# Patient Record
Sex: Female | Born: 1975 | Race: White | Marital: Married | Smoking: Former smoker
Health system: Southern US, Community
[De-identification: ages and names within clinical notes are randomized; demographics above are authoritative.]

## PROBLEM LIST (undated history)

## (undated) DIAGNOSIS — IMO0001 Reserved for inherently not codable concepts without codable children: Secondary | ICD-10-CM

## (undated) DIAGNOSIS — Z87442 Personal history of urinary calculi: Secondary | ICD-10-CM

## (undated) DIAGNOSIS — M5030 Other cervical disc degeneration, unspecified cervical region: Secondary | ICD-10-CM

## (undated) DIAGNOSIS — M4802 Spinal stenosis, cervical region: Secondary | ICD-10-CM

## (undated) DIAGNOSIS — T4145XA Adverse effect of unspecified anesthetic, initial encounter: Secondary | ICD-10-CM

## (undated) DIAGNOSIS — M47812 Spondylosis without myelopathy or radiculopathy, cervical region: Secondary | ICD-10-CM

## (undated) DIAGNOSIS — T8859XA Other complications of anesthesia, initial encounter: Secondary | ICD-10-CM

## (undated) DIAGNOSIS — I499 Cardiac arrhythmia, unspecified: Secondary | ICD-10-CM

## (undated) DIAGNOSIS — F419 Anxiety disorder, unspecified: Secondary | ICD-10-CM

## (undated) DIAGNOSIS — M1990 Unspecified osteoarthritis, unspecified site: Secondary | ICD-10-CM

## (undated) HISTORY — PX: TOTAL HIP ARTHROPLASTY: SHX124

## (undated) HISTORY — DX: Spondylosis without myelopathy or radiculopathy, cervical region: M47.812

## (undated) HISTORY — PX: OTHER SURGICAL HISTORY: SHX169

## (undated) HISTORY — DX: Spinal stenosis, cervical region: M48.02

## (undated) HISTORY — DX: Other cervical disc degeneration, unspecified cervical region: M50.30

## (undated) HISTORY — DX: Unspecified osteoarthritis, unspecified site: M19.90

## (undated) HISTORY — PX: CARDIAC ELECTROPHYSIOLOGY STUDY AND ABLATION: SHX1294

---

## 1997-05-30 HISTORY — PX: TUBAL LIGATION: SHX77

## 2013-01-28 HISTORY — PX: CYSTOSCOPY: SUR368

## 2014-12-24 ENCOUNTER — Other Ambulatory Visit: Payer: Self-pay | Admitting: Orthopedic Surgery

## 2014-12-24 DIAGNOSIS — M25312 Other instability, left shoulder: Secondary | ICD-10-CM

## 2015-01-06 ENCOUNTER — Ambulatory Visit
Admission: RE | Admit: 2015-01-06 | Discharge: 2015-01-06 | Disposition: A | Discharge status: Home / Self Care | Payer: BLUE CROSS/BLUE SHIELD | Source: Ambulatory Visit | Attending: Orthopedic Surgery | Admitting: Orthopedic Surgery

## 2015-01-06 DIAGNOSIS — M25312 Other instability, left shoulder: Secondary | ICD-10-CM

## 2015-01-06 MED ORDER — IOHEXOL 180 MG/ML  SOLN
15.0000 mL | Freq: Once | INTRAMUSCULAR | Status: DC | PRN
  Administered 2015-01-06: 15 mL via INTRA_ARTICULAR

## 2015-01-26 ENCOUNTER — Encounter (HOSPITAL_COMMUNITY)
Admission: RE | Admit: 2015-01-26 | Discharge: 2015-01-26 | Disposition: A | Discharge status: Home / Self Care | Payer: BLUE CROSS/BLUE SHIELD | Source: Ambulatory Visit | Attending: Orthopedic Surgery | Admitting: Orthopedic Surgery

## 2015-01-26 ENCOUNTER — Encounter (HOSPITAL_COMMUNITY): Payer: Self-pay

## 2015-01-26 DIAGNOSIS — F17210 Nicotine dependence, cigarettes, uncomplicated: Secondary | ICD-10-CM | POA: Diagnosis not present

## 2015-01-26 DIAGNOSIS — Z7982 Long term (current) use of aspirin: Secondary | ICD-10-CM | POA: Diagnosis not present

## 2015-01-26 DIAGNOSIS — F419 Anxiety disorder, unspecified: Secondary | ICD-10-CM | POA: Diagnosis not present

## 2015-01-26 DIAGNOSIS — M25312 Other instability, left shoulder: Secondary | ICD-10-CM | POA: Diagnosis present

## 2015-01-26 HISTORY — DX: Reserved for inherently not codable concepts without codable children: IMO0001

## 2015-01-26 HISTORY — DX: Personal history of urinary calculi: Z87.442

## 2015-01-26 HISTORY — DX: Adverse effect of unspecified anesthetic, initial encounter: T41.45XA

## 2015-01-26 HISTORY — DX: Cardiac arrhythmia, unspecified: I49.9

## 2015-01-26 HISTORY — DX: Anxiety disorder, unspecified: F41.9

## 2015-01-26 HISTORY — DX: Other complications of anesthesia, initial encounter: T88.59XA

## 2015-01-26 LAB — CBC WITH DIFFERENTIAL/PLATELET
BASOS ABS: 0 10*3/uL (ref 0.0–0.1)
BASOS PCT: 0 % (ref 0–1)
EOS ABS: 0.2 10*3/uL (ref 0.0–0.7)
EOS PCT: 2 % (ref 0–5)
HCT: 39.4 % (ref 36.0–46.0)
HEMOGLOBIN: 13.4 g/dL (ref 12.0–15.0)
Lymphocytes Relative: 50 % — ABNORMAL HIGH (ref 12–46)
Lymphs Abs: 4 10*3/uL (ref 0.7–4.0)
MCH: 31.2 pg (ref 26.0–34.0)
MCHC: 34 g/dL (ref 30.0–36.0)
MCV: 91.6 fL (ref 78.0–100.0)
Monocytes Absolute: 0.7 10*3/uL (ref 0.1–1.0)
Monocytes Relative: 9 % (ref 3–12)
NEUTROS PCT: 39 % — AB (ref 43–77)
Neutro Abs: 3.2 10*3/uL (ref 1.7–7.7)
PLATELETS: 227 10*3/uL (ref 150–400)
RBC: 4.3 MIL/uL (ref 3.87–5.11)
RDW: 12.9 % (ref 11.5–15.5)
WBC: 8.1 10*3/uL (ref 4.0–10.5)

## 2015-01-26 LAB — COMPREHENSIVE METABOLIC PANEL
ALBUMIN: 4.1 g/dL (ref 3.5–5.0)
ALT: 14 U/L (ref 14–54)
AST: 17 U/L (ref 15–41)
Alkaline Phosphatase: 43 U/L (ref 38–126)
Anion gap: 7 (ref 5–15)
BUN: 13 mg/dL (ref 6–20)
CHLORIDE: 105 mmol/L (ref 101–111)
CO2: 25 mmol/L (ref 22–32)
CREATININE: 0.71 mg/dL (ref 0.44–1.00)
Calcium: 9.5 mg/dL (ref 8.9–10.3)
GFR calc non Af Amer: >60 mL/min (ref >60)
GLUCOSE: 94 mg/dL (ref 65–99)
Potassium: 4.2 mmol/L (ref 3.5–5.1)
SODIUM: 137 mmol/L (ref 135–145)
Total Bilirubin: 0.5 mg/dL (ref 0.3–1.2)
Total Protein: 7 g/dL (ref 6.5–8.1)

## 2015-01-26 LAB — HCG, SERUM, QUALITATIVE: PREG SERUM: NEGATIVE

## 2015-01-26 LAB — PROTIME-INR
INR: 0.94 (ref 0.00–1.49)
Prothrombin Time: 12.8 seconds (ref 11.6–15.2)

## 2015-01-26 LAB — APTT: APTT: 27 s (ref 24–37)

## 2015-01-26 NOTE — Pre-Procedure Instructions (Addendum)
    Amber Vazquez  01/26/2015    Your procedure is scheduled on Thursday, September 1.  Report to [facility] Admitting at 5:30 A.M.                   Your surgery is scheduled for 7:30AM    Call this number if you have problems the morning of [phone]                For any other questions, please call [phone], Monday - Friday 8 AM - 4 PM.   Remember:  Do not eat food or drink liquids after midnight Wednesday, August 31.  Take these medicines the morning of surgery with A SIP OF WATER : metoprolol (LOPRESSOR).                  Take if needed: clonazePAM (KLONOPIN).                Stop taking Aspirin.   Do not wear jewelry, make-up or nail polish.  Do not wear lotions, powders, or perfumes.    Do not shave 48 hours prior to surgery.   Do not bring valuables to the hospital.  [facility] is not responsible for any belongings or valuables.  Contacts, dentures or bridgework may not be worn into surgery.  Leave your suitcase in the car.  After surgery it may be brought to your room.  For patients admitted to the hospital, discharge time will be determined by your treatment team.  Patients discharged the day of surgery will not be allowed to drive home.   Name and phone number of your driver:   -  Special instructions:  Review   - Preparing For Surgery.  Please read over the following fact sheets that you were given. Pain Booklet, Coughing and Deep Breathing and Surgical Site Infection Prevention  And Incentive Spirometery

## 2015-01-28 MED ORDER — CHLORHEXIDINE GLUCONATE 4 % EX LIQD
60.0000 mL | Freq: Once | CUTANEOUS | Status: DC
Start: 2015-01-29 — End: 2015-01-29

## 2015-01-28 MED ORDER — LACTATED RINGERS IV SOLN
INTRAVENOUS | Status: DC
  Administered 2015-01-29: 07:00:00 via INTRAVENOUS

## 2015-01-28 MED ORDER — CEFAZOLIN SODIUM-DEXTROSE 2-3 GM-% IV SOLR
2.0000 g | INTRAVENOUS | Status: AC
  Administered 2015-01-29: 2 g via INTRAVENOUS
  Filled 2015-01-28: qty 50

## 2015-01-29 ENCOUNTER — Ambulatory Visit (HOSPITAL_COMMUNITY): Payer: BLUE CROSS/BLUE SHIELD | Admitting: Certified Registered Nurse Anesthetist

## 2015-01-29 ENCOUNTER — Encounter (HOSPITAL_COMMUNITY): Payer: Self-pay | Admitting: *Deleted

## 2015-01-29 ENCOUNTER — Ambulatory Visit (HOSPITAL_COMMUNITY)
Admission: RE | Admit: 2015-01-29 | Discharge: 2015-01-29 | Disposition: A | Discharge status: Home / Self Care | Payer: BLUE CROSS/BLUE SHIELD | Source: Ambulatory Visit | Attending: Orthopedic Surgery | Admitting: Orthopedic Surgery

## 2015-01-29 ENCOUNTER — Encounter (HOSPITAL_COMMUNITY)
Admission: RE | Disposition: A | Discharge status: Home / Self Care | Payer: Self-pay | Source: Ambulatory Visit | Attending: Orthopedic Surgery

## 2015-01-29 DIAGNOSIS — F17210 Nicotine dependence, cigarettes, uncomplicated: Secondary | ICD-10-CM | POA: Insufficient documentation

## 2015-01-29 DIAGNOSIS — Z7982 Long term (current) use of aspirin: Secondary | ICD-10-CM | POA: Insufficient documentation

## 2015-01-29 DIAGNOSIS — M25312 Other instability, left shoulder: Secondary | ICD-10-CM | POA: Insufficient documentation

## 2015-01-29 DIAGNOSIS — F419 Anxiety disorder, unspecified: Secondary | ICD-10-CM | POA: Insufficient documentation

## 2015-01-29 HISTORY — PX: SHOULDER ARTHROSCOPY WITH BANKART REPAIR: SHX5673

## 2015-01-29 SURGERY — SHOULDER ARTHROSCOPY WITH BANKART REPAIR
Anesthesia: Regional | Site: Shoulder | Laterality: Left

## 2015-01-29 MED ORDER — DIAZEPAM 5 MG PO TABS: 2.5000 mg | ORAL_TABLET | Freq: Four times a day (QID) | ORAL | Status: DC | PRN

## 2015-01-29 MED ORDER — PROPOFOL 10 MG/ML IV BOLUS
INTRAVENOUS | Status: DC | PRN
  Administered 2015-01-29: 200 mg via INTRAVENOUS

## 2015-01-29 MED ORDER — SODIUM CHLORIDE 0.9 % IR SOLN
Status: DC | PRN
  Administered 2015-01-29: 6000 mL

## 2015-01-29 MED ORDER — ONDANSETRON HCL 4 MG/2ML IJ SOLN
INTRAMUSCULAR | Status: DC | PRN
Start: 2015-01-29 — End: 2015-01-29
  Administered 2015-01-29: 4 mg via INTRAVENOUS

## 2015-01-29 MED ORDER — MIDAZOLAM HCL 2 MG/2ML IJ SOLN
INTRAMUSCULAR | Status: AC
  Filled 2015-01-29: qty 4

## 2015-01-29 MED ORDER — NEOSTIGMINE METHYLSULFATE 10 MG/10ML IV SOLN
INTRAVENOUS | Status: AC
  Filled 2015-01-29: qty 1

## 2015-01-29 MED ORDER — FENTANYL CITRATE (PF) 250 MCG/5ML IJ SOLN
INTRAMUSCULAR | Status: AC
  Filled 2015-01-29: qty 5

## 2015-01-29 MED ORDER — MEPERIDINE HCL 25 MG/ML IJ SOLN: 6.2500 mg | INTRAMUSCULAR | Status: DC | PRN

## 2015-01-29 MED ORDER — PROPOFOL 10 MG/ML IV BOLUS
INTRAVENOUS | Status: AC
  Filled 2015-01-29: qty 20

## 2015-01-29 MED ORDER — FENTANYL CITRATE (PF) 100 MCG/2ML IJ SOLN
INTRAMUSCULAR | Status: DC | PRN
  Administered 2015-01-29: 50 ug via INTRAVENOUS
  Administered 2015-01-29 (×2): 100 ug via INTRAVENOUS

## 2015-01-29 MED ORDER — GLYCOPYRROLATE 0.2 MG/ML IJ SOLN
INTRAMUSCULAR | Status: DC | PRN
  Administered 2015-01-29: 0.6 mg via INTRAVENOUS

## 2015-01-29 MED ORDER — LIDOCAINE HCL (CARDIAC) 20 MG/ML IV SOLN
INTRAVENOUS | Status: DC | PRN
  Administered 2015-01-29: 50 mg via INTRAVENOUS

## 2015-01-29 MED ORDER — HYDROMORPHONE HCL 1 MG/ML IJ SOLN: 0.2500 mg | INTRAMUSCULAR | Status: DC | PRN

## 2015-01-29 MED ORDER — GLYCOPYRROLATE 0.2 MG/ML IJ SOLN
INTRAMUSCULAR | Status: AC
  Filled 2015-01-29: qty 3

## 2015-01-29 MED ORDER — NAPROXEN 500 MG PO TABS: 500.0000 mg | ORAL_TABLET | Freq: Two times a day (BID) | ORAL | Status: DC

## 2015-01-29 MED ORDER — ROCURONIUM BROMIDE 100 MG/10ML IV SOLN
INTRAVENOUS | Status: DC | PRN
  Administered 2015-01-29: 50 mg via INTRAVENOUS

## 2015-01-29 MED ORDER — MIDAZOLAM HCL 5 MG/5ML IJ SOLN
INTRAMUSCULAR | Status: DC | PRN
Start: 2015-01-29 — End: 2015-01-29
  Administered 2015-01-29 (×2): 2 mg via INTRAVENOUS

## 2015-01-29 MED ORDER — SUGAMMADEX SODIUM 500 MG/5ML IV SOLN
INTRAVENOUS | Status: AC
  Filled 2015-01-29: qty 5

## 2015-01-29 MED ORDER — ONDANSETRON HCL 4 MG PO TABS: 4.0000 mg | ORAL_TABLET | Freq: Three times a day (TID) | ORAL | Status: DC | PRN

## 2015-01-29 MED ORDER — NEOSTIGMINE METHYLSULFATE 10 MG/10ML IV SOLN
INTRAVENOUS | Status: DC | PRN
  Administered 2015-01-29: 5 mg via INTRAVENOUS

## 2015-01-29 MED ORDER — ONDANSETRON HCL 4 MG/2ML IJ SOLN: 4.0000 mg | Freq: Once | INTRAMUSCULAR | Status: DC | PRN

## 2015-01-29 MED ORDER — ONDANSETRON HCL 4 MG/2ML IJ SOLN
INTRAMUSCULAR | Status: AC
  Filled 2015-01-29: qty 2

## 2015-01-29 MED ORDER — OXYCODONE-ACETAMINOPHEN 5-325 MG PO TABS: 1.0000 | ORAL_TABLET | ORAL | Status: DC | PRN

## 2015-01-29 MED ORDER — LIDOCAINE HCL (CARDIAC) 20 MG/ML IV SOLN
INTRAVENOUS | Status: AC
  Filled 2015-01-29: qty 5

## 2015-01-29 MED ORDER — MIDAZOLAM HCL 5 MG/5ML IJ SOLN: INTRAMUSCULAR | Status: DC | PRN

## 2015-01-29 SURGICAL SUPPLY — 57 items
ANCHOR JUGGERKNOT SZ1 (Anchor) ×12 IMPLANT
BLADE CUTTER GATOR 3.5 (BLADE) ×3 IMPLANT
BLADE GREAT WHITE 4.2 (BLADE) ×2 IMPLANT
BLADE GREAT WHITE 4.2MM (BLADE) ×1
BLADE SURG 11 STRL SS (BLADE) ×3 IMPLANT
BOOTCOVER CLEANROOM LRG (PROTECTIVE WEAR) ×6 IMPLANT
BUR OVAL 4.0 (BURR) ×3 IMPLANT
CANISTER SUCT LVC 12 LTR MEDI- (MISCELLANEOUS) ×3 IMPLANT
CANNULA ACUFLEX KIT 5X76 (CANNULA) ×3 IMPLANT
CLOSURE WOUND 1/2 X4 (GAUZE/BANDAGES/DRESSINGS) ×1
DRAPE INCISE 23X17 IOBAN STRL (DRAPES)
DRAPE INCISE IOBAN 23X17 STRL (DRAPES) IMPLANT
DRAPE INCISE IOBAN 66X45 STRL (DRAPES) ×3 IMPLANT
DRAPE STERI 35X30 U-POUCH (DRAPES) IMPLANT
DRAPE SURG 17X11 SM STRL (DRAPES) ×3 IMPLANT
DRAPE U-SHAPE 47X51 STRL (DRAPES) IMPLANT
DRSG PAD ABDOMINAL 8X10 ST (GAUZE/BANDAGES/DRESSINGS) ×3 IMPLANT
DURAPREP 26ML APPLICATOR (WOUND CARE) ×6 IMPLANT
GAUZE SPONGE 4X4 12PLY STRL (GAUZE/BANDAGES/DRESSINGS) ×3 IMPLANT
GLOVE BIO SURGEON STRL SZ7.5 (GLOVE) ×3 IMPLANT
GLOVE BIO SURGEON STRL SZ8 (GLOVE) ×3 IMPLANT
GLOVE EUDERMIC 7 POWDERFREE (GLOVE) ×3 IMPLANT
GLOVE SS BIOGEL STRL SZ 7.5 (GLOVE) ×1 IMPLANT
GLOVE SUPERSENSE BIOGEL SZ 7.5 (GLOVE) ×2
GOWN STRL REUS W/ TWL LRG LVL3 (GOWN DISPOSABLE) ×1 IMPLANT
GOWN STRL REUS W/ TWL XL LVL3 (GOWN DISPOSABLE) ×2 IMPLANT
GOWN STRL REUS W/TWL LRG LVL3 (GOWN DISPOSABLE) ×2
GOWN STRL REUS W/TWL XL LVL3 (GOWN DISPOSABLE) ×4
KIT BASIN OR (CUSTOM PROCEDURE TRAY) ×3 IMPLANT
KIT ROOM TURNOVER OR (KITS) ×3 IMPLANT
KIT SHOULDER TRACTION (DRAPES) ×3 IMPLANT
MANIFOLD NEPTUNE II (INSTRUMENTS) ×3 IMPLANT
NDL SUT 6 .5 CRC .975X.05 MAYO (NEEDLE) IMPLANT
NEEDLE MAYO TAPER (NEEDLE)
NEEDLE SPNL 18GX3.5 QUINCKE PK (NEEDLE) ×3 IMPLANT
NS IRRIG 1000ML POUR BTL (IV SOLUTION) ×3 IMPLANT
PACK SHOULDER (CUSTOM PROCEDURE TRAY) ×3 IMPLANT
PAD ARMBOARD 7.5X6 YLW CONV (MISCELLANEOUS) ×6 IMPLANT
SET ARTHROSCOPY TUBING (MISCELLANEOUS) ×2
SET ARTHROSCOPY TUBING LN (MISCELLANEOUS) ×1 IMPLANT
SLING ARM IMMOBILIZER LRG (SOFTGOODS) IMPLANT
SLING ARM LRG ADULT FOAM STRAP (SOFTGOODS) IMPLANT
SLING ARM MED ADULT FOAM STRAP (SOFTGOODS) IMPLANT
SPONGE GAUZE 4X4 12PLY STER LF (GAUZE/BANDAGES/DRESSINGS) ×3 IMPLANT
SPONGE LAP 4X18 X RAY DECT (DISPOSABLE) IMPLANT
STRIP CLOSURE SKIN 1/2X4 (GAUZE/BANDAGES/DRESSINGS) ×2 IMPLANT
SUT LASSO 45 DEG R (SUTURE) ×3 IMPLANT
SUT MNCRL AB 3-0 PS2 18 (SUTURE) ×3 IMPLANT
SUT MNCRL AB 4-0 PS2 18 (SUTURE) IMPLANT
SUT PDS AB 1 CT  36 (SUTURE)
SUT PDS AB 1 CT 36 (SUTURE) IMPLANT
SYR 20CC LL (SYRINGE) IMPLANT
TAPE PAPER 3X10 WHT MICROPORE (GAUZE/BANDAGES/DRESSINGS) ×3 IMPLANT
TOWEL OR 17X24 6PK STRL BLUE (TOWEL DISPOSABLE) ×3 IMPLANT
TOWEL OR 17X26 10 PK STRL BLUE (TOWEL DISPOSABLE) ×3 IMPLANT
WAND SUCTION MAX 4MM 90S (SURGICAL WAND) ×3 IMPLANT
WATER STERILE IRR 1000ML POUR (IV SOLUTION) ×3 IMPLANT

## 2015-01-29 NOTE — Op Note (Signed)
01/29/2015  9:35 AM  PATIENT:   Amber Vazquez  39 y.o. female  PRE-OPERATIVE DIAGNOSIS:  LEFT SHOULDER RECURRENT ANTERIOR INSTABILITY, LOOSE BODY  POST-OPERATIVE DIAGNOSIS:  SAME  PROCEDURE:  LSA, LOOSE BODY REMOVAL, BANKART REPAIR  SURGEON:  Vanya Carberry, Vania Rea. M.D.  ASSISTANTS: Shuford pac   ANESTHESIA:   GET + ISB  EBL: min  SPECIMEN:  none  Drains: noone   PATIENT DISPOSITION:  PACU - hemodynamically stable.    PLAN OF CARE: Discharge to home after PACU  Dictation# [id number]   Contact # [phone]

## 2015-01-29 NOTE — Anesthesia Procedure Notes (Addendum)
Anesthesia Regional Block:  Interscalene brachial plexus block  Pre-Anesthetic Checklist: ,, timeout performed, Correct Patient, Correct Site, Correct Laterality, Correct Procedure, Correct Position, site marked, Risks and benefits discussed,  Surgical consent,  Pre-op evaluation,  At surgeon's request and post-op pain management  Laterality: Left  Prep: chloraprep       Needles:  Injection technique: Single-shot  Needle Type: Echogenic Stimulator Needle     Needle Length: 9cm 9 cm Needle Gauge: 21 and 21 G    Additional Needles:  Procedures: ultrasound guided (picture in chart) and nerve stimulator Interscalene brachial plexus block  Nerve Stimulator or Paresthesia:  Response: 0.4 mA,   Additional Responses:   Narrative:  Start time: 01/29/2015 7:05 AM End time: 01/29/2015 7:15 AM  Performed by: Personally  Anesthesiologist: Arta Bruce  Additional Notes: Monitors applied. Patient sedated. Sterile prep and drape,hand hygiene and sterile gloves were used. Relevant anatomy identified.Needle position confirmed.Local anesthetic injected incrementally after negative aspiration. Local anesthetic spread visualized around nerve(s). Vascular puncture avoided. No complications. Image printed for medical record.The patient tolerated the procedure well.        Procedure Name: Intubation Date/Time: 01/29/2015 7:47 AM Performed by: Marylyn Ishihara Pre-anesthesia Checklist: Patient identified, Emergency Drugs available, Suction available, Patient being monitored and Timeout performed Patient Re-evaluated:Patient Re-evaluated prior to inductionOxygen Delivery Method: Circle system utilized Preoxygenation: Pre-oxygenation with 100% oxygen Intubation Type: IV induction Ventilation: Mask ventilation without difficulty Laryngoscope Size: Mac and 3 Grade View: Grade I Tube size: 7.5 mm Number of attempts: 1 Airway Equipment and Method: Stylet Placement Confirmation: ETT inserted through  vocal cords under direct vision and positive ETCO2 Secured at: 21 cm Tube secured with: Tape Dental Injury: Teeth and Oropharynx as per pre-operative assessment

## 2015-01-29 NOTE — Progress Notes (Signed)
Dr. Rennis Chris contacted via phone. Pt to begin passive exercises tomorrow.

## 2015-01-29 NOTE — Transfer of Care (Signed)
Immediate Anesthesia Transfer of Care Note  Patient: Amber Vazquez  Procedure(s) Performed: Procedure(s): LEFT SHOULDER ARTHROSCOPY WITH [employer] REPAIR, LOOSE BODY REMOVAL (Left)  Patient Location: PACU  Anesthesia Type:GA combined with regional for post-op pain  Level of Consciousness: awake, pateint uncooperative, confused and responds to stimulation  Airway & Oxygen Therapy: Patient Spontanous Breathing and Patient connected to nasal cannula oxygen  Post-op Assessment: Report given to RN, Post -op Vital signs reviewed and stable, Patient moving all extremities X 4 and Patient able to stick tongue midline  Post vital signs: stable  Last Vitals:  Filed Vitals:   01/29/15 0550  BP: 118/78  Pulse: 66  Temp: 36.7 C  Resp: 20    Complications: No apparent anesthesia complications

## 2015-01-29 NOTE — Discharge Instructions (Signed)
° °  Kevin M. Supple, M.D., F.A.A.O.S. °Orthopaedic Surgery °Specializing in Arthroscopic and Reconstructive °Surgery of the Shoulder and Knee °[phone] °[address] - [place], [place] [address] - Fax [phone] ° °POST-OP SHOULDER ARTHROSCOPIC  LABRAL REPAIR INSTRUCTIONS ° °1. Call the office at [phone] to schedule your first post-op appointment 7-10 days from the date of your surgery. ° °2. Leave the steri-strips in place over your incisions when performing dressing changes and showering. You may remove your dressings and begin showering 72 hours from surgery. You can expect drainage that is clear to bloody in nature that occasionally will soak through your dressings. If this occurs go ahead and perform a dressing change. The drainage should lessen daily and when there is no drainage from your incisions feel free to go without a dressing. ° °3. Wear your sling/immobilizer at all times except to perform the exercises below or to occasionally let your arm dangle by your side to stretch your elbow. You also need to sleep in your sling immobilizer until instructed otherwise. ° °4. Range of motion to your elbow, wrist, and hand are encouraged 3-5 times daily. Exercise to your hand and fingers helps to reduce swelling you may experience. ° °5. Utilize ice to the shoulder 3-4 times minimum a day and additionally if you are experiencing pain. ° °6. You may one-armed drive when safely off of narcotics and muscle relaxants. You may use your hand that is in the sling to support the steering wheel only. However, should it be your right arm that is in the sling it is not to be used for gear shifting in a manual transmission. ° °7. If you had a block pre-operatively to provide post-op pain relief you may want to go ahead and begin utilizing your pain meds as your arm begins to wake up. Blocks can sometimes last up to 16-18 hours. If you are still pain-free prior to going to bed you may want to strongly  consider taking a pain medication to avoid being awakened in the night with the onset of pain. A muscle relaxant is also provided for you should you experience muscle spasms. It is recommended that if you are experiencing pain that your pain medication alone is not controlling, add the muscle relaxant along with the pain medication which can give additional pain relief. The first one to two days is generally the most severe of your pain and then should gradually decrease. As your pain lessens it is recommended that you decrease your use of the pain medications to an "as needed basis" only and to always comply with the recommended dosages of the pain medications. ° °8. Pain medications can produce constipation along with their use. If you experience this, the use of an over the counter stool softener or laxative daily is recommended.  ° °9. For additional questions or concerns, please do not hesitate to call the office. If after hours there is an answering service to forward your concerns to the physician on call. ° °POST-OP EXERCISES ° °Pendulum Exercises ° °Perform pendulum exercises while standing and bending at the waist. Support your uninvolved arm on a table or chair and allow your operated arm to hang freely. Make sure to do these exercises passively - not using you shoulder muscle. ° °Repeat 20 times. Do 3 sessions per day. ° ° °

## 2015-01-29 NOTE — H&P (Signed)
Amber Vazquez    Chief Complaint: LEFT SHOULDER REOCURRING INSTABILITY  HPI: The patient is a 39 y.o. female with recurrent left shoulder instability  Past Medical History  Diagnosis Date  . Complication of anesthesia     "fight like a mad cat when waking up"  . Dysrhythmia     Hx OF afib  . Shortness of breath dyspnea     hx of Afib- 2014  . Anxiety     Panic attacks  . History of kidney stones     Past Surgical History  Procedure Laterality Date  . Cystoscopy  01/2013    Kidney stones x 3 1999, 2006  . Tubal ligation  1999  . Cardiac electrophysiology study and ablation      History reviewed. No pertinent family history.  Social History:  reports that she has been smoking.  She does not have any smokeless tobacco history on file. She reports that she drinks alcohol. She reports that she does not use illicit drugs.  Allergies:  Allergies  Allergen Reactions  . Prednisone Nausea And Vomiting and Other (See Comments)    Rapid heart beat    Medications Prior to Admission  Medication Sig Dispense Refill  . aspirin EC 325 MG tablet Take 325 mg by mouth daily.    Marland Kitchen aspirin EC 81 MG tablet Take 325 mg by mouth daily.     . clonazePAM (KLONOPIN) 0.5 MG tablet Take 0.5 mg by mouth 2 (two) times daily as needed for anxiety. Takes 1/2 tab    . metoprolol (LOPRESSOR) 100 MG tablet Take 100 mg by mouth 2 (two) times daily.       Physical Exam: left shoulder with painful and retstricted motion as noted at recent office visits  Vitals  Temp:  [98 F (36.7 C)] 98 F (36.7 C) (09/01 0550) Pulse Rate:  [66] 66 (09/01 0550) Resp:  [20] 20 (09/01 0550) BP: (118)/(78) 118/78 mmHg (09/01 0550) SpO2:  [99 %] 99 % (09/01 0550) Weight:  [93.243 kg (205 lb 9 oz)] 93.243 kg (205 lb 9 oz) (09/01 0550)  Assessment/Plan  Impression: LEFT SHOULDER REOCURRING INSTABILITY  Plan of Action: Procedure(s): LEFT SHOULDER ARTHROSCOPY WITH BANKHARDT REPAIR, LOOSE BODY REMOVAL  Shaye Elling M  Cayden Rautio 01/29/2015, 6:55 AM Contact # [phone]

## 2015-01-29 NOTE — Op Note (Signed)
NAME:  Amber Vazquez, Amber Vazquez NO.:  [id number]  MEDICAL RECORD NO.:  [id number]  LOCATION:  [facility]                         FACILITY:  [facility]  PHYSICIAN:  Vania Rea. Ashleymarie Granderson, M.D.  DATE OF BIRTH:  [birth year]  DATE OF PROCEDURE:  01/29/2015 DATE OF DISCHARGE:  01/29/2015                              OPERATIVE REPORT   PREOPERATIVE DIAGNOSES: 1. Recurrent left shoulder anterior instability. 2. Left shoulder intra-articular loose body.  POSTOPERATIVE DIAGNOSES: 1. Recurrent left shoulder anterior instability. 2. Left shoulder intra-articular loose body.  PROCEDURE: 1. Left shoulder examination under anesthesia and left shoulder     glenoid joint diagnostic arthroscopy. 2. Loose body removal. 3. Arthroscopic Bankart repair.  SURGEON:  Francena Hanly, M.D.  ASSISTANT:  French Ana A. Shuford, PA-C  ANESTHESIA:  General endotracheal as well as interscalene block.  ESTIMATED BLOOD LOSS:  Minimal.  DRAINS:  None.  HISTORY:  Amber Vazquez is a 39 year old female who has had chronic bilateral shoulder instability, left more symptomatic than the right but both severely incapacitating due to recurrent instability.  Preoperative MRI scan confirms changes in left shoulder consistent with a Bankart defect as well as Hill-Sachs defect and intra-articular loose body.  Due to her ongoing pain, instability, and functional limitations, she is brought to the operating room at this time for planned left shoulder arthroscopic stabilization as described below.  Preoperatively, I counseled Amber Vazquez regarding treatment options and potential risks versus benefits thereof.  Possible surgical complications were all reviewed including bleeding, infection, neurovascular injury, persistent pain, loss of motion, anesthetic complication, recurrence of instability, possible need for additional surgery.  She understands and accepts and agrees to planned procedure.  PROCEDURE IN DETAIL:  After undergoing  routine preop evaluation, the patient received prophylactic antibiotics and an interscalene block was established in the holding room by the Anesthesia Department.  Placed supine on the operating table, underwent smooth induction of a general endotracheal anesthesia.  Turned to the right lateral decubitus position on a beanbag and appropriately padded and protected.  I should mention examination under anesthesia confirmed marked anterior instability. Left shoulder girdle region was sterilely prepped and draped in standard fashion.  Time-out was called.  Posterior portal established in the glenohumeral joint and anterior portal was established under direct visualization.  The majority of the articular surfaces was in good condition.  There was, however, a moderate-sized Hill-Sachs defect on the posterior superior humeral head.  In addition, there was some bony erosion of the anterior margin of the glenoid, and there was evidence for a small residual fleck of bony tissue that had been detached with the primarily soft tissue Bankart defect.  The anterior labrum was stripped from the 10 o'clock down to the 6 o'clock position.  I mobilized the anterior inferior quadrant of the labrum with a periosteal elevator freeing it up from scarified position medially on the glenoid and went past the 6 o'clock to the 5:30 position to confirm that the inferior labrum was also freed and could be mobilized superior ward. Used then a hand rasp to create a bony bed on the margin of the anterior glenoid from 10 o'clock down to the 6 o'clock  position.  Collene Mares was then used to debride the residual soft tissue and bony debris.  We confirmed then that the anterior band of the inferior glenoid ligament could be appropriately shifted superior ward much to our satisfaction.  At this point, an anterior inferior portal was established in the upper margin of the subscapularis.  We placed a series of 4 juggernaut suture  anchors equidistant across the width of the anterior inferior arc of the glenoid from 6:30 up to the 9:30 position.  The suture limbs were all then shuttled through the anterior band of the inferior glenohumeral ligament and the adjacent associated labral tissue allowing a superior ward shift as well as a plication of tissues recreating an anterior-inferior capsulolabral "bumper" and reducing the redundant capsular volume and nicely reapproximating the tissues to the margin of the glenoid.  All suture limbs were passed in a horizontal mattress pattern.  This allowed [place] to nicely relocate the humeral head centered on the glenoid and eliminate the redundant anterior capsular volume and reapproximating the labrum much to our satisfaction.  Suture limbs were all then clipped.  I should mention prior to completion, we did identify loose body which was in the subscapularis recess and this was removed with a grasper.  I should mention that the superior labrum and biceps tendon were normal. Rotator cuff was carefully inspected and found to be intact.  The posterior capsular bone and posterior labrum were of normal volume and normal attachment respectively.  At this point, all fluid and instrument were removed.  The portals were closed with Monocryl and Steri-Strips. A dry dressing taped at the left shoulder.  Left arm was placed in a sling.  The patient was awakened, extubated, and taken to recovery room in stable condition.  [name], PA-C was used as an Geophysicist/field seismologist throughout this case, essential for help with positioning of the patient, positioning of the extremity, management of the arthroscopic equipment, tissue manipulation, suture management, wound closer, and intraoperative decision making.     Vania Rea. [name], M.D.     KMS/MEDQ  D:  01/29/2015  T:  01/29/2015  Job:  [id number]

## 2015-01-29 NOTE — Anesthesia Preprocedure Evaluation (Signed)
Anesthesia Evaluation  Patient identified by MRN, date of birth, ID band Patient awake    Reviewed: Allergy & Precautions, NPO status , Patient's Chart, lab work & pertinent test results  Airway Mallampati: I  TM Distance: >3 FB Neck ROM: Full    Dental   Pulmonary Current Smoker,    Pulmonary exam normal       Cardiovascular Normal cardiovascular exam+ dysrhythmias Atrial Fibrillation  S/P Ablation 2/16   Neuro/Psych Anxiety    GI/Hepatic   Endo/Other    Renal/GU      Musculoskeletal   Abdominal   Peds  Hematology   Anesthesia Other Findings   Reproductive/Obstetrics                             Anesthesia Physical Anesthesia Plan  ASA: III  Anesthesia Plan: General   Post-op Pain Management: GA combined w/ Regional for post-op pain   Induction: Intravenous  Airway Management Planned: Oral ETT  Additional Equipment:   Intra-op Plan:   Post-operative Plan: Extubation in OR  Informed Consent: I have reviewed the patients History and Physical, chart, labs and discussed the procedure including the risks, benefits and alternatives for the proposed anesthesia with the patient or authorized representative who has indicated his/her understanding and acceptance.     Plan Discussed with: CRNA and Surgeon  Anesthesia Plan Comments:         Anesthesia Quick Evaluation

## 2015-01-30 ENCOUNTER — Encounter (HOSPITAL_COMMUNITY): Payer: Self-pay | Admitting: Orthopedic Surgery

## 2015-01-30 NOTE — Anesthesia Postprocedure Evaluation (Signed)
Anesthesia Post Note  Patient: Amber Vazquez  Procedure(s) Performed: Procedure(s) (LRB): LEFT SHOULDER ARTHROSCOPY WITH BANKHARDT REPAIR, LOOSE BODY REMOVAL (Left)  Anesthesia type: general  Patient location: PACU  Post pain: Pain level controlled  Post assessment: Patient's Cardiovascular Status Stable  Last Vitals:  Filed Vitals:   01/29/15 1036  BP: 129/98  Pulse: 70  Temp:   Resp: 16    Post vital signs: Reviewed and stable  Level of consciousness: sedated  Complications: No apparent anesthesia complications

## 2015-02-17 NOTE — Addendum Note (Signed)
Addendum  created 02/17/15 1555 by Val Eagle, MD   Modules edited: Anesthesia Responsible Staff

## 2015-09-09 DIAGNOSIS — I8312 Varicose veins of left lower extremity with inflammation: Secondary | ICD-10-CM | POA: Diagnosis not present

## 2015-09-09 DIAGNOSIS — I8311 Varicose veins of right lower extremity with inflammation: Secondary | ICD-10-CM | POA: Diagnosis not present

## 2015-09-25 DIAGNOSIS — H6500 Acute serous otitis media, unspecified ear: Secondary | ICD-10-CM | POA: Diagnosis not present

## 2015-09-25 DIAGNOSIS — L84 Corns and callosities: Secondary | ICD-10-CM | POA: Diagnosis not present

## 2015-09-25 DIAGNOSIS — Z6834 Body mass index (BMI) 34.0-34.9, adult: Secondary | ICD-10-CM | POA: Diagnosis not present

## 2015-09-25 DIAGNOSIS — E669 Obesity, unspecified: Secondary | ICD-10-CM | POA: Diagnosis not present

## 2015-10-02 DIAGNOSIS — M2040 Other hammer toe(s) (acquired), unspecified foot: Secondary | ICD-10-CM | POA: Diagnosis not present

## 2015-10-02 DIAGNOSIS — L600 Ingrowing nail: Secondary | ICD-10-CM | POA: Diagnosis not present

## 2015-10-12 DIAGNOSIS — N390 Urinary tract infection, site not specified: Secondary | ICD-10-CM | POA: Diagnosis not present

## 2015-10-12 DIAGNOSIS — N200 Calculus of kidney: Secondary | ICD-10-CM | POA: Diagnosis not present

## 2015-10-12 DIAGNOSIS — R109 Unspecified abdominal pain: Secondary | ICD-10-CM | POA: Diagnosis not present

## 2015-10-12 DIAGNOSIS — R3129 Other microscopic hematuria: Secondary | ICD-10-CM | POA: Diagnosis not present

## 2015-10-30 DIAGNOSIS — L600 Ingrowing nail: Secondary | ICD-10-CM | POA: Diagnosis not present

## 2015-10-30 DIAGNOSIS — M2040 Other hammer toe(s) (acquired), unspecified foot: Secondary | ICD-10-CM | POA: Diagnosis not present

## 2015-11-09 DIAGNOSIS — Z7982 Long term (current) use of aspirin: Secondary | ICD-10-CM | POA: Diagnosis not present

## 2015-11-09 DIAGNOSIS — N838 Other noninflammatory disorders of ovary, fallopian tube and broad ligament: Secondary | ICD-10-CM | POA: Diagnosis not present

## 2015-11-09 DIAGNOSIS — Z79891 Long term (current) use of opiate analgesic: Secondary | ICD-10-CM | POA: Diagnosis not present

## 2015-11-09 DIAGNOSIS — N760 Acute vaginitis: Secondary | ICD-10-CM | POA: Diagnosis not present

## 2015-11-09 DIAGNOSIS — N83201 Unspecified ovarian cyst, right side: Secondary | ICD-10-CM | POA: Diagnosis not present

## 2015-11-09 DIAGNOSIS — R1032 Left lower quadrant pain: Secondary | ICD-10-CM | POA: Diagnosis not present

## 2015-11-09 DIAGNOSIS — N839 Noninflammatory disorder of ovary, fallopian tube and broad ligament, unspecified: Secondary | ICD-10-CM | POA: Diagnosis not present

## 2015-11-09 DIAGNOSIS — F17200 Nicotine dependence, unspecified, uncomplicated: Secondary | ICD-10-CM | POA: Diagnosis not present

## 2015-11-09 DIAGNOSIS — B9689 Other specified bacterial agents as the cause of diseases classified elsewhere: Secondary | ICD-10-CM | POA: Diagnosis not present

## 2015-12-15 DIAGNOSIS — I8311 Varicose veins of right lower extremity with inflammation: Secondary | ICD-10-CM | POA: Diagnosis not present

## 2015-12-17 DIAGNOSIS — I8311 Varicose veins of right lower extremity with inflammation: Secondary | ICD-10-CM | POA: Diagnosis not present

## 2015-12-30 DIAGNOSIS — I8311 Varicose veins of right lower extremity with inflammation: Secondary | ICD-10-CM | POA: Diagnosis not present

## 2016-01-04 DIAGNOSIS — I8311 Varicose veins of right lower extremity with inflammation: Secondary | ICD-10-CM | POA: Diagnosis not present

## 2016-01-13 DIAGNOSIS — I8312 Varicose veins of left lower extremity with inflammation: Secondary | ICD-10-CM | POA: Diagnosis not present

## 2016-01-18 DIAGNOSIS — I8312 Varicose veins of left lower extremity with inflammation: Secondary | ICD-10-CM | POA: Diagnosis not present

## 2016-03-09 DIAGNOSIS — I8312 Varicose veins of left lower extremity with inflammation: Secondary | ICD-10-CM | POA: Diagnosis not present

## 2016-04-12 DIAGNOSIS — I8311 Varicose veins of right lower extremity with inflammation: Secondary | ICD-10-CM | POA: Diagnosis not present

## 2016-04-27 DIAGNOSIS — I8312 Varicose veins of left lower extremity with inflammation: Secondary | ICD-10-CM | POA: Diagnosis not present

## 2016-05-16 DIAGNOSIS — R1032 Left lower quadrant pain: Secondary | ICD-10-CM | POA: Diagnosis not present

## 2016-05-16 DIAGNOSIS — Z888 Allergy status to other drugs, medicaments and biological substances status: Secondary | ICD-10-CM | POA: Diagnosis not present

## 2016-05-16 DIAGNOSIS — M79605 Pain in left leg: Secondary | ICD-10-CM | POA: Diagnosis not present

## 2016-05-16 DIAGNOSIS — R590 Localized enlarged lymph nodes: Secondary | ICD-10-CM | POA: Diagnosis not present

## 2016-05-16 DIAGNOSIS — I4891 Unspecified atrial fibrillation: Secondary | ICD-10-CM | POA: Diagnosis not present

## 2016-05-16 DIAGNOSIS — F17200 Nicotine dependence, unspecified, uncomplicated: Secondary | ICD-10-CM | POA: Diagnosis not present

## 2016-05-16 DIAGNOSIS — R591 Generalized enlarged lymph nodes: Secondary | ICD-10-CM | POA: Diagnosis not present

## 2016-05-16 DIAGNOSIS — M25311 Other instability, right shoulder: Secondary | ICD-10-CM | POA: Diagnosis not present

## 2016-05-28 DIAGNOSIS — J208 Acute bronchitis due to other specified organisms: Secondary | ICD-10-CM | POA: Diagnosis not present

## 2016-05-28 DIAGNOSIS — R05 Cough: Secondary | ICD-10-CM | POA: Diagnosis not present

## 2016-05-28 DIAGNOSIS — J0100 Acute maxillary sinusitis, unspecified: Secondary | ICD-10-CM | POA: Diagnosis not present

## 2016-05-31 DIAGNOSIS — M25311 Other instability, right shoulder: Secondary | ICD-10-CM | POA: Diagnosis not present

## 2016-05-31 DIAGNOSIS — Z6832 Body mass index (BMI) 32.0-32.9, adult: Secondary | ICD-10-CM | POA: Diagnosis not present

## 2016-05-31 DIAGNOSIS — I471 Supraventricular tachycardia: Secondary | ICD-10-CM | POA: Diagnosis not present

## 2016-06-01 DIAGNOSIS — M79652 Pain in left thigh: Secondary | ICD-10-CM | POA: Diagnosis not present

## 2016-06-01 DIAGNOSIS — M79605 Pain in left leg: Secondary | ICD-10-CM | POA: Diagnosis not present

## 2016-06-01 DIAGNOSIS — M7981 Nontraumatic hematoma of soft tissue: Secondary | ICD-10-CM | POA: Diagnosis not present

## 2016-08-09 DIAGNOSIS — M25511 Pain in right shoulder: Secondary | ICD-10-CM | POA: Diagnosis not present

## 2016-08-09 DIAGNOSIS — G8918 Other acute postprocedural pain: Secondary | ICD-10-CM | POA: Diagnosis not present

## 2016-08-09 DIAGNOSIS — M24411 Recurrent dislocation, right shoulder: Secondary | ICD-10-CM | POA: Diagnosis not present

## 2016-08-09 DIAGNOSIS — M25311 Other instability, right shoulder: Secondary | ICD-10-CM | POA: Diagnosis not present

## 2016-08-09 DIAGNOSIS — Z791 Long term (current) use of non-steroidal anti-inflammatories (NSAID): Secondary | ICD-10-CM | POA: Diagnosis not present

## 2016-08-09 DIAGNOSIS — M24111 Other articular cartilage disorders, right shoulder: Secondary | ICD-10-CM | POA: Diagnosis not present

## 2016-08-09 DIAGNOSIS — Z8679 Personal history of other diseases of the circulatory system: Secondary | ICD-10-CM | POA: Diagnosis not present

## 2016-08-20 DIAGNOSIS — M25311 Other instability, right shoulder: Secondary | ICD-10-CM | POA: Diagnosis not present

## 2016-08-20 DIAGNOSIS — Z791 Long term (current) use of non-steroidal anti-inflammatories (NSAID): Secondary | ICD-10-CM | POA: Diagnosis not present

## 2016-08-20 DIAGNOSIS — Z8679 Personal history of other diseases of the circulatory system: Secondary | ICD-10-CM | POA: Diagnosis not present

## 2016-08-20 DIAGNOSIS — M25511 Pain in right shoulder: Secondary | ICD-10-CM | POA: Diagnosis not present

## 2016-08-30 DIAGNOSIS — Z791 Long term (current) use of non-steroidal anti-inflammatories (NSAID): Secondary | ICD-10-CM | POA: Diagnosis not present

## 2016-08-30 DIAGNOSIS — M25311 Other instability, right shoulder: Secondary | ICD-10-CM | POA: Diagnosis not present

## 2016-08-30 DIAGNOSIS — Z8679 Personal history of other diseases of the circulatory system: Secondary | ICD-10-CM | POA: Diagnosis not present

## 2016-08-30 DIAGNOSIS — M25511 Pain in right shoulder: Secondary | ICD-10-CM | POA: Diagnosis not present

## 2016-09-12 DIAGNOSIS — M25511 Pain in right shoulder: Secondary | ICD-10-CM | POA: Diagnosis not present

## 2016-09-12 DIAGNOSIS — Z791 Long term (current) use of non-steroidal anti-inflammatories (NSAID): Secondary | ICD-10-CM | POA: Diagnosis not present

## 2016-09-12 DIAGNOSIS — M25311 Other instability, right shoulder: Secondary | ICD-10-CM | POA: Diagnosis not present

## 2016-09-12 DIAGNOSIS — Z8679 Personal history of other diseases of the circulatory system: Secondary | ICD-10-CM | POA: Diagnosis not present

## 2016-09-29 DIAGNOSIS — M25511 Pain in right shoulder: Secondary | ICD-10-CM | POA: Diagnosis not present

## 2016-10-03 DIAGNOSIS — M25511 Pain in right shoulder: Secondary | ICD-10-CM | POA: Diagnosis not present

## 2016-10-10 DIAGNOSIS — M25511 Pain in right shoulder: Secondary | ICD-10-CM | POA: Diagnosis not present

## 2016-10-13 DIAGNOSIS — M25511 Pain in right shoulder: Secondary | ICD-10-CM | POA: Diagnosis not present

## 2016-10-17 DIAGNOSIS — M25511 Pain in right shoulder: Secondary | ICD-10-CM | POA: Diagnosis not present

## 2016-10-20 DIAGNOSIS — M25511 Pain in right shoulder: Secondary | ICD-10-CM | POA: Diagnosis not present

## 2016-10-26 DIAGNOSIS — M25511 Pain in right shoulder: Secondary | ICD-10-CM | POA: Diagnosis not present

## 2016-10-28 DIAGNOSIS — M25511 Pain in right shoulder: Secondary | ICD-10-CM | POA: Diagnosis not present

## 2016-11-01 DIAGNOSIS — M25511 Pain in right shoulder: Secondary | ICD-10-CM | POA: Diagnosis not present

## 2016-11-03 DIAGNOSIS — M25511 Pain in right shoulder: Secondary | ICD-10-CM | POA: Diagnosis not present

## 2016-11-07 DIAGNOSIS — M25511 Pain in right shoulder: Secondary | ICD-10-CM | POA: Diagnosis not present

## 2016-11-10 DIAGNOSIS — M25511 Pain in right shoulder: Secondary | ICD-10-CM | POA: Diagnosis not present

## 2016-11-15 DIAGNOSIS — M25511 Pain in right shoulder: Secondary | ICD-10-CM | POA: Diagnosis not present

## 2016-11-18 DIAGNOSIS — M25511 Pain in right shoulder: Secondary | ICD-10-CM | POA: Diagnosis not present

## 2016-11-22 DIAGNOSIS — M25511 Pain in right shoulder: Secondary | ICD-10-CM | POA: Diagnosis not present

## 2016-11-25 DIAGNOSIS — M25511 Pain in right shoulder: Secondary | ICD-10-CM | POA: Diagnosis not present

## 2016-11-29 DIAGNOSIS — M25511 Pain in right shoulder: Secondary | ICD-10-CM | POA: Diagnosis not present

## 2016-12-01 DIAGNOSIS — M25511 Pain in right shoulder: Secondary | ICD-10-CM | POA: Diagnosis not present

## 2016-12-06 DIAGNOSIS — M25511 Pain in right shoulder: Secondary | ICD-10-CM | POA: Diagnosis not present

## 2016-12-08 DIAGNOSIS — M25511 Pain in right shoulder: Secondary | ICD-10-CM | POA: Diagnosis not present

## 2016-12-13 DIAGNOSIS — M25511 Pain in right shoulder: Secondary | ICD-10-CM | POA: Diagnosis not present

## 2016-12-14 DIAGNOSIS — Z4789 Encounter for other orthopedic aftercare: Secondary | ICD-10-CM | POA: Diagnosis not present

## 2016-12-15 DIAGNOSIS — M25511 Pain in right shoulder: Secondary | ICD-10-CM | POA: Diagnosis not present

## 2016-12-19 DIAGNOSIS — M25511 Pain in right shoulder: Secondary | ICD-10-CM | POA: Diagnosis not present

## 2016-12-26 DIAGNOSIS — M25511 Pain in right shoulder: Secondary | ICD-10-CM | POA: Diagnosis not present

## 2016-12-28 DIAGNOSIS — M25511 Pain in right shoulder: Secondary | ICD-10-CM | POA: Diagnosis not present

## 2016-12-29 IMAGING — MR MR SHOULDER*L* W/ CM
5 series · 38 of 40 positions shown · IV contrast (agent unspecified)
Comparison: None.

CLINICAL DATA: Chronic history of left shoulder dislocation. Left
shoulder pain and limited range of motion. Initial encounter.

EXAM:
MR ARTHROGRAM OF THE LEFT SHOULDER
TECHNIQUE: Multiplanar, multisequence MR imaging of the left shoulder was
performed following the administration of intra-articular contrast.
CONTRAST:  See Injection Documentation.

[Series 8: T2 fat-sat · coronal · 4.0mm · 0.55mm/px · 7 of 19 slices shown (1 of 2)]
[im 1/19]
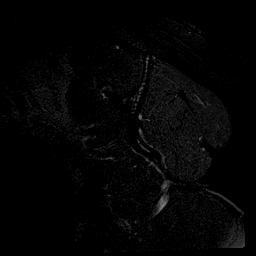
[im 4/19]
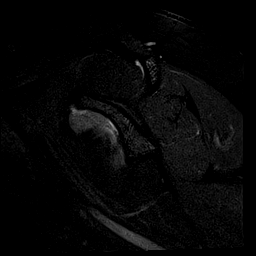
[im 7/19]
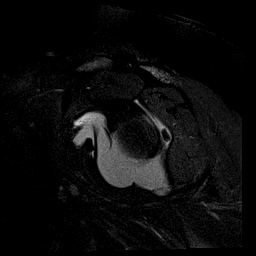
[im 10/19]
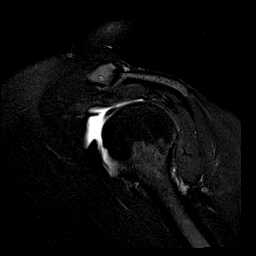
[im 13/19]
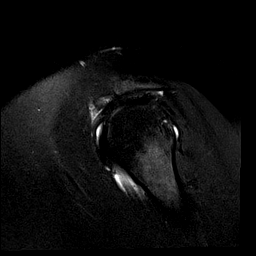
[im 16/19]
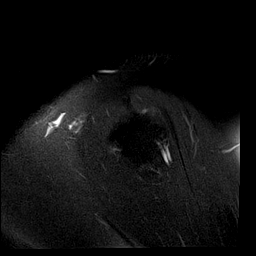
[im 19/19]
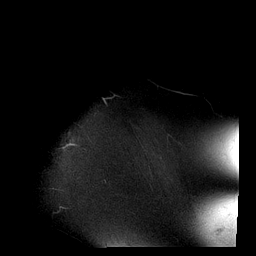

[Series 9: T1 fat-sat · oblique · 4.0mm · 0.44mm/px · 8 of 18 slices shown (1 of 3)]
[im 1/18]
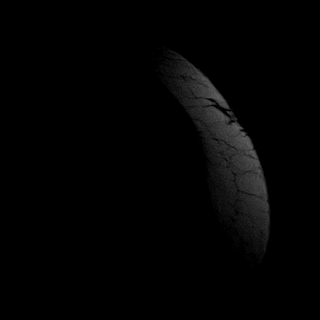
[im 3/18]
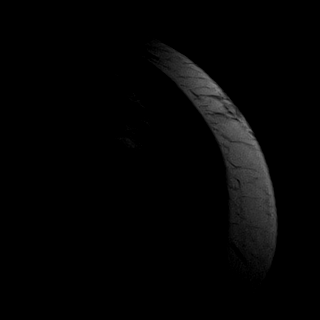
[im 5/18]
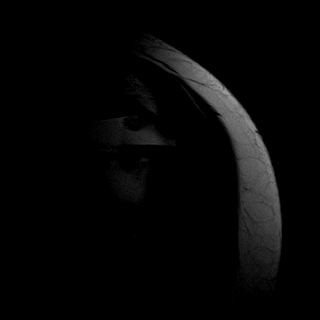
[im 8/18]
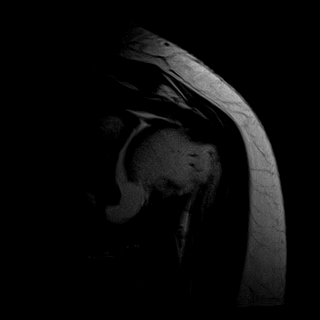
[im 10/18]
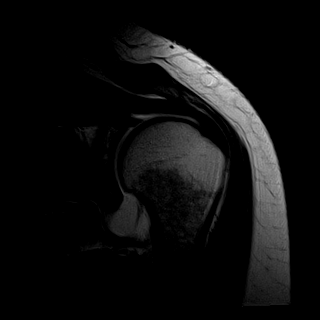
[im 13/18]
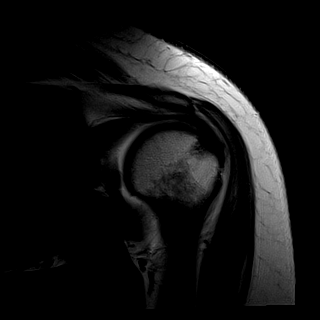
[im 15/18]
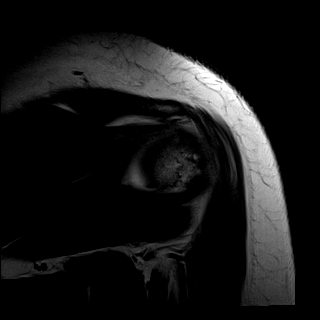
[im 18/18]
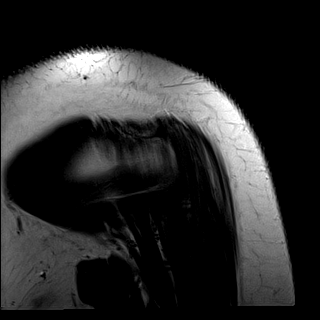

[Series 10: T1 fat-sat · axial · 4.0mm · 0.25mm/px · z∈[-58,+29]mm · 9 of 20 slices shown (2 of 3)]
[im 1/20]
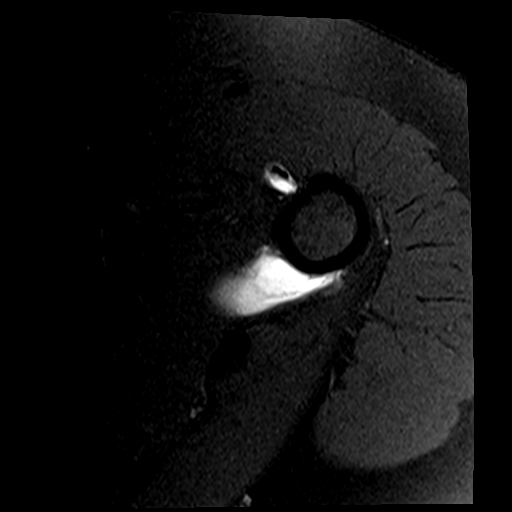
[im 3/20]
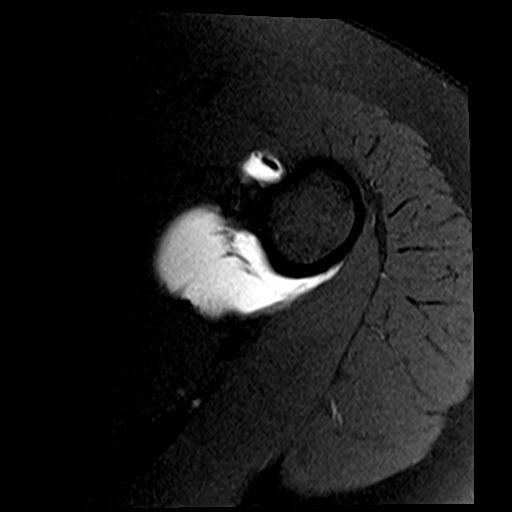
[im 5/20]
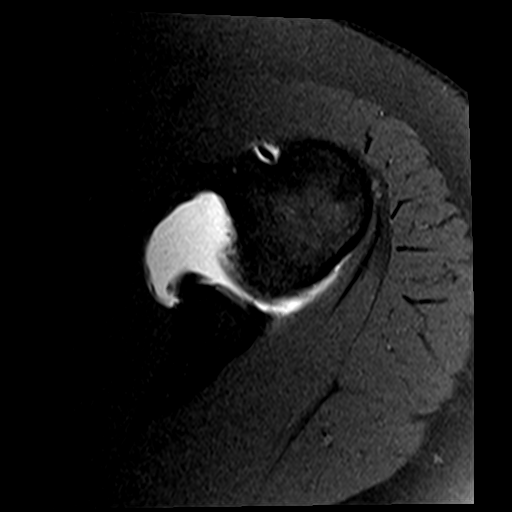
[im 8/20]
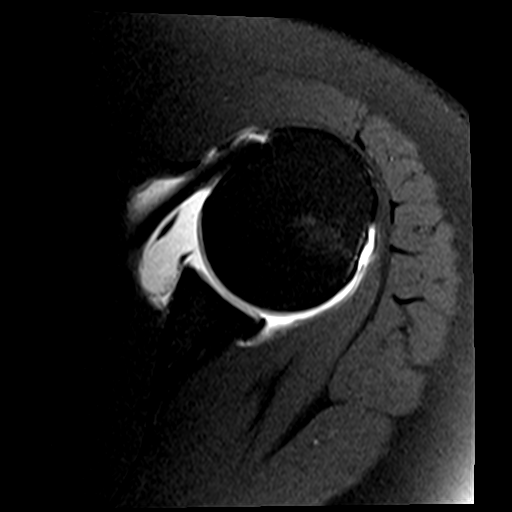
[im 10/20]
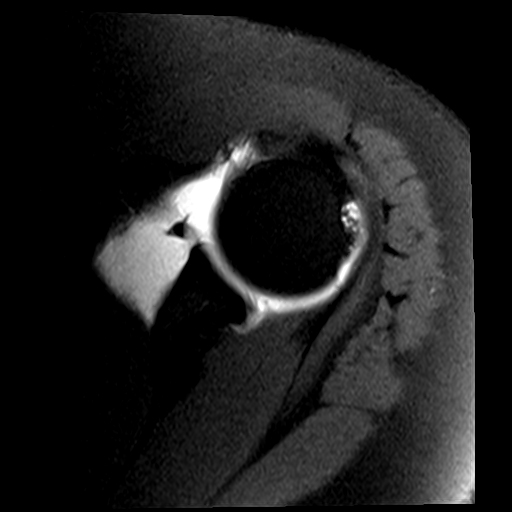
[im 12/20]
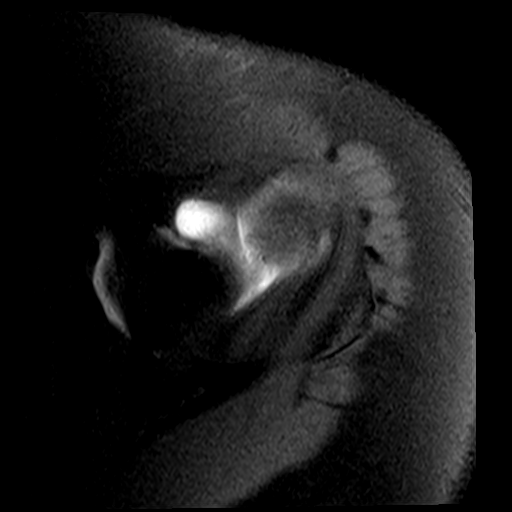
[im 15/20]
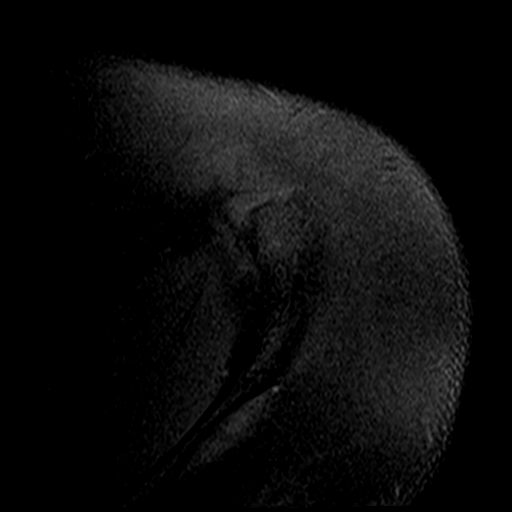
[im 17/20]
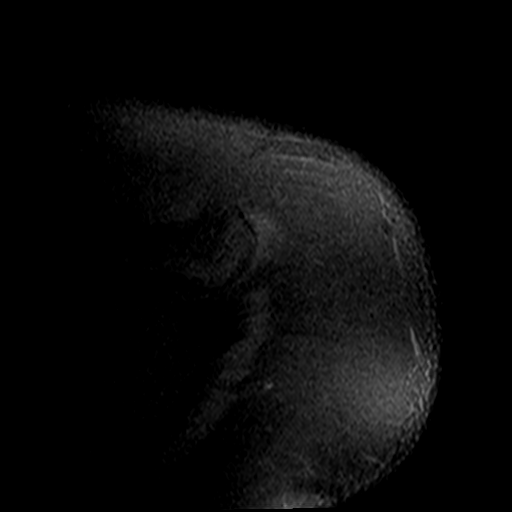
[im 20/20]
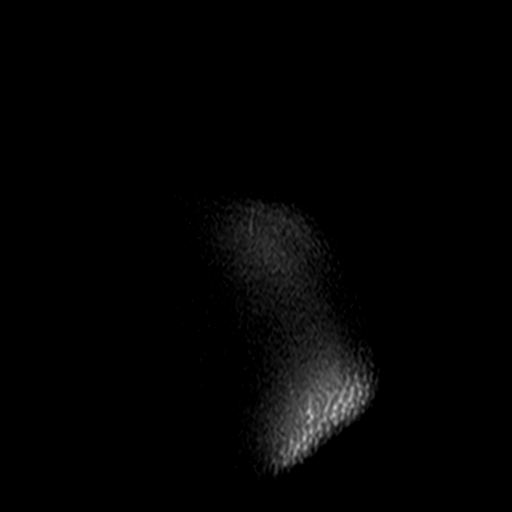

[Series 11: T1 fat-sat · oblique · 4.0mm · 0.55mm/px · 6 of 18 slices shown (3 of 3)]
[im 1/18]
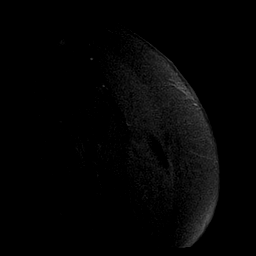
[im 3/18]
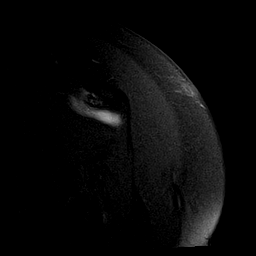
[im 5/18]
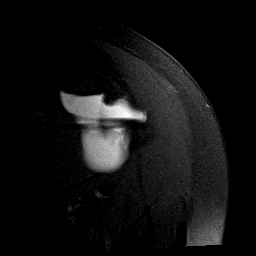
[im 8/18]
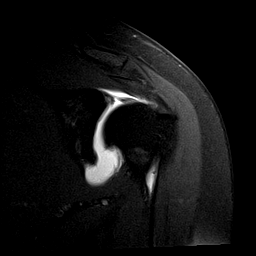
[im 10/18]
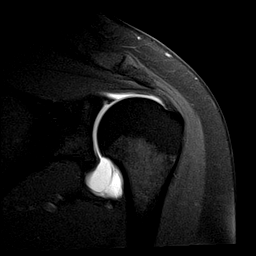
[im 13/18]
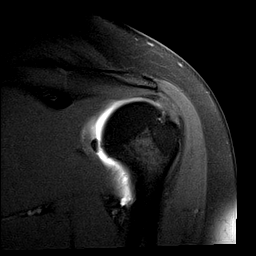

[Series 12: T2 fat-sat · oblique · 4.0mm · 0.55mm/px · 8 of 18 slices shown (2 of 2)]
[im 1/18]
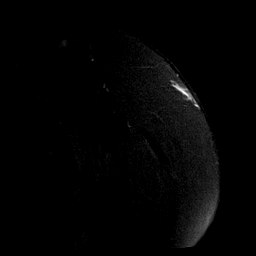
[im 3/18]
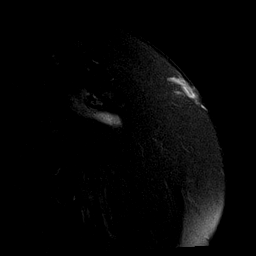
[im 5/18]
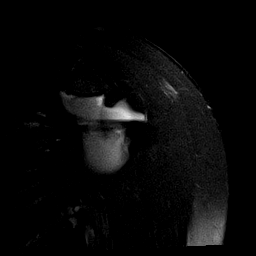
[im 8/18]
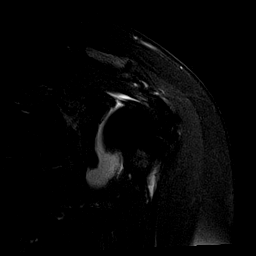
[im 10/18]
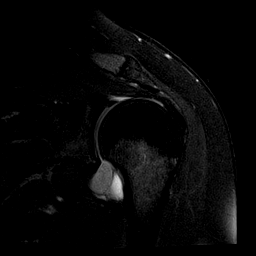
[im 13/18]
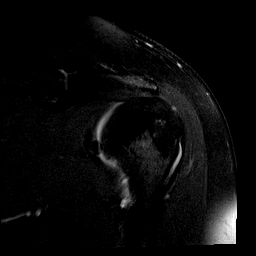
[im 15/18]
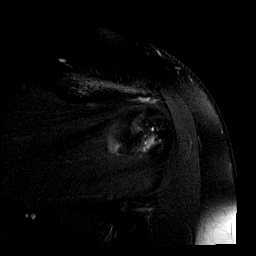
[im 18/18]
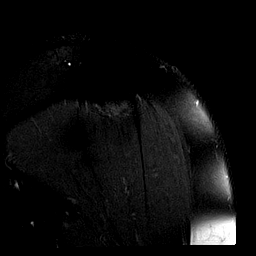

[38 of 40 positions shown; findings below may reference images not displayed]

FINDINGS: Rotator cuff: Heterogeneously increased T2 signal in the
supraspinatus and infraspinatus tendons consistent with tendinopathy
is identified. The rotator cuff is intact.

Muscles: Normal in appearance without atrophy or focal lesion.

Biceps long head: Intact.

Acromioclavicular Joint: Mild to moderate degenerative change is
seen.

Glenohumeral Joint: A 0.8 cm in diameter loose body is seen in the
posterior aspect of the joint at the 9 o'clock position of the
glenoid.

Labrum: The patient has an anterior to inferior labral tear. The
tear begins at approximately the 3 o'clock position that extends to
near the 6 o'clock position of the inferior labrum. The labrum is
extremely diminutive and degenerated in this area.

Bones: No bony Bankart is identified. There may be a very small and
remote Hill-Sachs lesion. The acromion is type 2. No fluid or
contrast is seen in the subacromial/subdeltoid bursa.
IMPRESSION: Anterior to inferior labral tear extends from approximately the 3
o'clock to the 6 o'clock position. The labrum is diminutive and
degenerated in this area. No bony Bankart is identified. The patient
may have a very small Hill-Sachs deformity.

0.8 cm in diameter loose body in the glenohumeral joint at the 9
o'clock position of the posterior glenoid.

Mild appearing rotator cuff tendinopathy without tear.

Mild to moderate acromioclavicular osteoarthritis.

## 2017-01-03 DIAGNOSIS — M25511 Pain in right shoulder: Secondary | ICD-10-CM | POA: Diagnosis not present

## 2017-01-05 DIAGNOSIS — M25511 Pain in right shoulder: Secondary | ICD-10-CM | POA: Diagnosis not present

## 2017-01-10 DIAGNOSIS — M25511 Pain in right shoulder: Secondary | ICD-10-CM | POA: Diagnosis not present

## 2017-01-12 DIAGNOSIS — M25511 Pain in right shoulder: Secondary | ICD-10-CM | POA: Diagnosis not present

## 2017-01-18 DIAGNOSIS — M25511 Pain in right shoulder: Secondary | ICD-10-CM | POA: Diagnosis not present

## 2017-01-20 DIAGNOSIS — M25511 Pain in right shoulder: Secondary | ICD-10-CM | POA: Diagnosis not present

## 2017-01-24 DIAGNOSIS — M25511 Pain in right shoulder: Secondary | ICD-10-CM | POA: Diagnosis not present

## 2017-01-26 DIAGNOSIS — M25511 Pain in right shoulder: Secondary | ICD-10-CM | POA: Diagnosis not present

## 2017-02-01 DIAGNOSIS — Z4789 Encounter for other orthopedic aftercare: Secondary | ICD-10-CM | POA: Diagnosis not present

## 2017-04-04 DIAGNOSIS — F419 Anxiety disorder, unspecified: Secondary | ICD-10-CM | POA: Diagnosis not present

## 2017-06-16 DIAGNOSIS — J039 Acute tonsillitis, unspecified: Secondary | ICD-10-CM | POA: Diagnosis not present

## 2017-06-30 DIAGNOSIS — Z1331 Encounter for screening for depression: Secondary | ICD-10-CM | POA: Diagnosis not present

## 2017-06-30 DIAGNOSIS — Z0000 Encounter for general adult medical examination without abnormal findings: Secondary | ICD-10-CM | POA: Diagnosis not present

## 2017-06-30 DIAGNOSIS — E669 Obesity, unspecified: Secondary | ICD-10-CM | POA: Diagnosis not present

## 2017-06-30 DIAGNOSIS — Z1231 Encounter for screening mammogram for malignant neoplasm of breast: Secondary | ICD-10-CM | POA: Diagnosis not present

## 2017-07-11 DIAGNOSIS — Z1231 Encounter for screening mammogram for malignant neoplasm of breast: Secondary | ICD-10-CM | POA: Diagnosis not present

## 2017-08-18 DIAGNOSIS — R300 Dysuria: Secondary | ICD-10-CM | POA: Diagnosis not present

## 2017-08-18 DIAGNOSIS — K121 Other forms of stomatitis: Secondary | ICD-10-CM | POA: Diagnosis not present

## 2017-08-18 DIAGNOSIS — K1230 Oral mucositis (ulcerative), unspecified: Secondary | ICD-10-CM | POA: Diagnosis not present

## 2017-09-08 DIAGNOSIS — K1230 Oral mucositis (ulcerative), unspecified: Secondary | ICD-10-CM | POA: Diagnosis not present

## 2017-09-08 DIAGNOSIS — K121 Other forms of stomatitis: Secondary | ICD-10-CM | POA: Diagnosis not present

## 2018-01-01 DIAGNOSIS — Z01411 Encounter for gynecological examination (general) (routine) with abnormal findings: Secondary | ICD-10-CM | POA: Diagnosis not present

## 2018-01-01 DIAGNOSIS — R102 Pelvic and perineal pain: Secondary | ICD-10-CM | POA: Diagnosis not present

## 2018-01-01 DIAGNOSIS — Z6830 Body mass index (BMI) 30.0-30.9, adult: Secondary | ICD-10-CM | POA: Diagnosis not present

## 2018-01-16 DIAGNOSIS — N941 Unspecified dyspareunia: Secondary | ICD-10-CM | POA: Diagnosis not present

## 2018-01-16 DIAGNOSIS — R102 Pelvic and perineal pain: Secondary | ICD-10-CM | POA: Diagnosis not present

## 2018-04-05 DIAGNOSIS — Z720 Tobacco use: Secondary | ICD-10-CM | POA: Diagnosis not present

## 2018-04-05 DIAGNOSIS — I480 Paroxysmal atrial fibrillation: Secondary | ICD-10-CM | POA: Diagnosis not present

## 2018-04-05 DIAGNOSIS — I471 Supraventricular tachycardia: Secondary | ICD-10-CM | POA: Diagnosis not present

## 2020-11-11 ENCOUNTER — Encounter: Payer: Self-pay | Admitting: Neurology

## 2020-11-11 ENCOUNTER — Ambulatory Visit: Payer: BLUE CROSS/BLUE SHIELD | Admitting: Neurology

## 2020-11-11 VITALS — BP 135/75 | HR 85 | Ht 62.0 in | Wt 180.0 lb

## 2020-11-11 DIAGNOSIS — M5481 Occipital neuralgia: Secondary | ICD-10-CM

## 2020-11-11 DIAGNOSIS — R519 Headache, unspecified: Secondary | ICD-10-CM

## 2020-11-11 MED ORDER — GABAPENTIN 300 MG PO CAPS
300.0000 mg | ORAL_CAPSULE | Freq: Three times a day (TID) | ORAL | 6 refills | Status: DC | PRN
Start: 2020-11-11 — End: 2021-08-02

## 2020-11-11 MED ORDER — AMITRIPTYLINE HCL 25 MG PO TABS: 25.0000 mg | ORAL_TABLET | Freq: Every day | ORAL | 3 refills | Status: DC

## 2020-11-11 NOTE — Patient Instructions (Signed)
Amitriptyline at bedtime Gabapentin as needed up to 3x a day  Occipital Neuralgia  Occipital neuralgia is a type of headache that causes brief episodes of very bad pain in the back of the head. Pain from occipital neuralgia may spread (radiate) to other parts of the head. These headaches may be caused by irritation of the nerves that leave the spinal cord high up in the neck, just below the base of the skull (occipital nerves). The occipital nerves transmit sensations from the back of the head, the topof the head, and the areas behind the ears. What are the causes? This condition can occur without any known cause (primary headache syndrome). In other cases, this condition is caused by pressure on or irritation of one of the two occipital nerves. Pressure and irritation may be due to: Muscle spasm in the neck. Neck injury. Wear and tear of the vertebrae in the neck (osteoarthritis). Disease of the disks that separate the vertebrae. Swollen blood vessels that put pressure on the occipital nerves. Infections. Tumors. Diabetes. What are the signs or symptoms? This condition causes brief burning, stabbing, electric, shocking, or shooting pain in the back of the head that can radiate to the top of the head. It can happen on one side or both sides of the head. It can also cause: Pain behind the eye. Pain triggered by neck movement or hair brushing. Scalp tenderness. Aching in the back of the head between episodes of very bad pain. Pain that gets worse with exposure to bright lights. How is this diagnosed? Your health care provider may diagnose the condition based on a physical exam and your symptoms. Tests may be done, such as: Imaging studies of the brain and neck (cervical spine), such as an MRI or CT scan. These look for causes of pinched nerves. Applying pressure to the nerves in the neck to try to re-create the pain. Injection of numbing medicine into the occipital nerve areas to see if  pain goes away (diagnostic nerve block). How is this treated? Treatment for this condition may begin with simple measures, such as: Rest. Massage. Applying heat or cold to the area. Over-the-counter pain relievers. If these measures do not work, you may need other treatments, including: Medicines, such as: Prescription-strength anti-inflammatory medicines. Muscle relaxants. Anti-seizure medicines, which can relieve pain. Antidepressants, which can relieve pain. Injected medicines, such as medicines that numb the area (local anesthetic) and steroids. Pulsed radiofrequency ablation. This is when wires are implanted to deliver electrical impulses that block pain signals from the occipital nerve. Surgery to relieve nerve pressure. Physical therapy. Follow these instructions at home: Managing pain     Avoid any activities that cause pain. Rest when you have an attack of pain. Try gentle massage to relieve pain. Try a different pillow or sleeping position. If directed, apply heat to the affected area as often as told by your health care provider. Use the heat source that your health care provider recommends, such as a moist heat pack or a heating pad. Place a towel between your skin and the heat source. Leave the heat on for 20-30 minutes. Remove the heat if your skin turns bright red. This is especially important if you are unable to feel pain, heat, or cold. You have a greater risk of getting burned. If directed, put ice on the back of your head and neck area. To do this: Put ice in a plastic bag. Place a towel between your skin and the bag. Leave the ice  on for 20 minutes, 2-3 times a day. Remove the ice if your skin turns bright red. This is very important. If you cannot feel pain, heat, or cold, you have a greater risk of damage to the area. General instructions Take over-the-counter and prescription medicines only as told by your health care provider. Avoid things that make your  symptoms worse, such as bright lights. Try to stay active. Get regular exercise that does not cause pain. Ask your health care provider to suggest safe exercises for you. Work with a physical therapist to learn stretching exercises you can do at home. Practice good posture. Keep all follow-up visits. This is important. Contact a health care provider if: Your medicine is not working. You have new or worsening symptoms. Get help right away if: You have very bad head pain that does not go away. You have a sudden change in vision, balance, or speech. These symptoms may represent a serious problem that is an emergency. Do not wait to see if the symptoms will go away. Get medical help right away. Call your local emergency services (911 in the [place] Do not drive yourself to the hospital. Summary Occipital neuralgia is a type of headache that causes brief episodes of very bad pain in the back of the head. Pain from occipital neuralgia may spread (radiate) to other parts of the head. Treatment for this condition includes rest, massage, and medicines. This information is not intended to replace advice given to you by your health care provider. Make sure you discuss any questions you have with your healthcare provider. Document Revised: 03/15/2020 Document Reviewed: 03/15/2020 Elsevier Patient Education  2022 [employer] Gabapentin Capsules or Tablets What is this medication? GABAPENTIN (GA ba pen tin) treats nerve pain. It may also be used to prevent and control seizures in people with epilepsy. It works by Teacher, English as a foreign language in Public relations account executive. This medicine may be used for other purposes; ask your health care provider orpharmacist if you have questions. COMMON BRAND NAME(S): Active-PAC with Gabapentin, Ascencion Dike, Gralise, Neurontin What should I tell my care team before I take this medication? They need to know if you have any of these conditions: Alcohol or substance use disorder Kidney  disease Lung or breathing disease Suicidal thoughts, plans, or attempt; a previous suicide attempt by you or a family member An unusual or allergic reaction to gabapentin, other medications, foods, dyes, or preservatives Pregnant or trying to get pregnant Breast-feeding How should I use this medication? Take this medication by mouth with a glass of water. Follow the directions on the prescription label. You can take it with or without food. If it upsets your stomach, take it with food. Take your medication at regular intervals. Do not take it more often than directed. Do not stop taking except on your care team'sadvice. If you are directed to break the 600 or 800 mg tablets in half as part of your dose, the extra half tablet should be used for the next dose. If you have notused the extra half tablet within 28 days, it should be thrown away. A special MedGuide will be given to you by the pharmacist with eachprescription and refill. Be sure to read this information carefully each time. Talk to your care team about the use of this medication in children. While this medication may be prescribed for children as young as 3 years for selectedconditions, precautions do apply. Overdosage: If you think you have taken too much of this medicine contact apoison control center or  emergency room at once. NOTE: This medicine is only for you. Do not share this medicine with others. What if I miss a dose? If you miss a dose, take it as soon as you can. If it is almost time for yournext dose, take only that dose. Do not take double or extra doses. What may interact with this medication? Alcohol Antihistamines for allergy, cough, and cold Certain medications for anxiety or sleep Certain medications for depression like amitriptyline, fluoxetine, sertraline Certain medications for seizures like phenobarbital, primidone Certain medications for stomach problems General anesthetics like halothane, isoflurane,  methoxyflurane, propofol Local anesthetics like lidocaine, pramoxine, tetracaine Medications that relax muscles for surgery Narcotic medications for pain Phenothiazines like chlorpromazine, mesoridazine, prochlorperazine, thioridazine This list may not describe all possible interactions. Give your health care provider a list of all the medicines, herbs, non-prescription drugs, or dietary supplements you use. Also tell them if you smoke, drink alcohol, or use illegaldrugs. Some items may interact with your medicine. What should I watch for while using this medication? Visit your care team for regular checks on your progress. You may want to keep a record at home of how you feel your condition is responding to treatment. You may want to share this information with your care team at each visit. You should contact your care team if your seizures get worse or if you have any new types of seizures. Do not stop taking this medication or any of your seizure medications unless instructed by your care team. Stopping your medicationsuddenly can increase your seizures or their severity. This medication may cause serious skin reactions. They can happen weeks to months after starting the medication. Contact your care team right away if you notice fevers or flu-like symptoms with a rash. The rash may be red or purple and then turn into blisters or peeling of the skin. Or, you might notice a red rash with swelling of the face, lips or lymph nodes in your neck or under yourarms. Wear a medical identification bracelet or chain if you are taking thismedication for seizures. Carry a card that lists all your medications. You may get drowsy, dizzy, or have blurred vision. Do not drive, use machinery, or do anything that needs mental alertness until you know how this medication affects you. To reduce dizzy or fainting spells, do not sit or stand up quickly, especially if you are an older patient. Alcohol can increasedrowsiness  and dizziness. Your mouth may get dry. Chewing sugarless gum or sucking hard candy, anddrinking plenty of water may help. Watch for new or worsening thoughts of suicide or depression. This includes sudden changes in mood, behaviors, or thoughts. These changes can happen at any time but are more common in the beginning of treatment or after a change in dose. Call your care team right away if you experience these thoughts orworsening depression. If you become pregnant while using this medication, you may enroll in the [place] Antiepileptic Drug Pregnancy Registry by calling [phone]. This registry collects information about the safety of antiepileptic medication useduring pregnancy. What side effects may I notice from receiving this medication? Side effects that you should report to your care team as soon as possible: Allergic reactions or angioedema-skin rash, itching, hives, swelling of the face, eyes, lips, tongue, arms, or legs, trouble swallowing or breathing Rash, fever, and swollen lymph nodes Thoughts of suicide or self harm, worsening mood, feelings of depression Trouble breathing Unusual changes in mood or behavior in children after use such as  difficulty concentrating, hostility, or restlessness Side effects that usually do not require medical attention (report to your careteam if they continue or are bothersome): Dizziness Drowsiness Nausea Swelling of ankles, feet, or hands Vomiting This list may not describe all possible side effects. Call your doctor for medical advice about side effects. You may report side effects to FDA at1-800-FDA-1088. Where should I keep my medication? Keep out of reach of children and pets. Store at room temperature between 15 and 30 degrees C (59 and 86 degrees F).Get rid of any unused medication after the expiration date. This medication may cause accidental overdose and death if taken by other adults, children, or pets. Mix any unused  medication with a substance like cat litter or coffee grounds. Then throw the medication away in a sealed containerlike a sealed bag or a coffee can with a lid. NOTE: This sheet is a summary. It may not cover all possible information. If you have questions about this medicine, talk to your doctor, pharmacist, orhealth care provider.  2022 Elsevier/Gold Standard (2020-04-01 12:16:18) Amitriptyline Tablets What is this medication? AMITRIPTYLINE (a mee TRIP ti leen) treats depression. It increases the amount of serotonin and norepinephrine in the brain, hormones that help regulate mood.It belongs to a group of medications called tricyclic antidepressants (TCAs). This medicine may be used for other purposes; ask your health care provider orpharmacist if you have questions. COMMON BRAND NAME(S): Elavil, Vanatrip What should I tell my care team before I take this medication? They need to know if you have any of these conditions: An alcohol problem Asthma, trouble breathing Bipolar disorder or schizophrenia Difficulty passing urine, prostate trouble Glaucoma Heart disease or previous heart attack Liver disease Over active thyroid Seizures Thoughts or plans of suicide, a previous suicide attempt, or family history of suicide attempt An unusual or allergic reaction to amitriptyline, other medications, foods, dyes, or preservatives Pregnant or trying to get pregnant Breast-feeding How should I use this medication? Take this medication by mouth with a drink of water. Follow the directions on the prescription label. You can take the tablets with or without food. Take your medication at regular intervals. Do not take it more often than directed. Do not stop taking this medication suddenly except upon the advice of your care team. Stopping this medication too quickly may cause serious side effects oryour condition may worsen. A special MedGuide will be given to you by the pharmacist with eachprescription  and refill. Be sure to read this information carefully each time. Talk to your care team regarding the use of this medication in children.Special care may be needed. Overdosage: If you think you have taken too much of this medicine contact apoison control center or emergency room at once. NOTE: This medicine is only for you. Do not share this medicine with others. What if I miss a dose? If you miss a dose, take it as soon as you can. If it is almost time for yournext dose, take only that dose. Do not take double or extra doses. What may interact with this medication? Do not take this medication with any of the following: Arsenic trioxide Certain medications used to regulate abnormal heartbeat or to treat other heart conditions Cisapride Droperidol Halofantrine Linezolid MAOIs like Carbex, Eldepryl, Marplan, Nardil, and Parnate Methylene blue Other medications for mental depression Phenothiazines like perphenazine, thioridazine and chlorpromazine Pimozide Probucol Procarbazine Sparfloxacin St. John's Wort This medication may also interact with the following: Atropine and related medications like hyoscyamine, scopolamine, tolterodine and others  Barbiturate medications for inducing sleep or treating seizures, like phenobarbital Cimetidine Disulfiram Ethchlorvynol Thyroid hormones such as levothyroxine Ziprasidone This list may not describe all possible interactions. Give your health care provider a list of all the medicines, herbs, non-prescription drugs, or dietary supplements you use. Also tell them if you smoke, drink alcohol, or use illegaldrugs. Some items may interact with your medicine. What should I watch for while using this medication? Tell your care team if your symptoms do not get better or if they get worse. Visit your care team for regular checks on your progress. Because it may take several weeks to see the full effects of this medication, it is important tocontinue your  treatment as prescribed by your care team. Patients and their families should watch out for new or worsening thoughts of suicide or depression. Also watch out for sudden changes in feelings such as feeling anxious, agitated, panicky, irritable, hostile, aggressive, impulsive, severely restless, overly excited and hyperactive, or not being able to sleep. If this happens, especially at the beginning of treatment or after a change indose, call your care team. You may get drowsy or dizzy. Do not drive, use machinery, or do anything that needs mental alertness until you know how this medication affects you. Do not stand or sit up quickly, especially if you are an older patient. This reduces the risk of dizzy or fainting spells. Alcohol may interfere with the effect ofthis medication. Avoid alcoholic drinks. Do not treat yourself for coughs, colds, or allergies without asking your careteam for advice. Some ingredients can increase possible side effects. Your mouth may get dry. Chewing sugarless gum or sucking hard candy, and drinking plenty of water will help. Contact your care team if the problem doesnot go away or is severe. This medication may cause dry eyes and blurred vision. If you wear contact lenses you may feel some discomfort. Lubricating drops may help. See your eyedoctor if the problem does not go away or is severe. This medication can cause constipation. Try to have a bowel movement at least every 2 to 3 days. If you do not have a bowel movement for 3 days, call yourcare team. This medication can make you more sensitive to the sun. Keep out of the sun. If you cannot avoid being in the sun, wear protective clothing and use sunscreen.Do not use sun lamps or tanning beds/booths. What side effects may I notice from receiving this medication? Side effects that you should report to your care team as soon as possible: Allergic reactions-skin rash, itching, hives, swelling of the face, lips, tongue, or  throat Heart rhythm changes- fast or irregular heartbeat, dizziness, feeling faint or lightheaded, chest pain, trouble breathing Serotonin syndrome-irritability, confusion, fast or irregular heartbeat, muscle stiffness, twitching muscles, sweating, high fever, seizure, chills, vomiting, diarrhea Sudden eye pain or change in vision such as blurry vision, seeing halos around lights, vision loss Thoughts of suicide or self-harm, worsening mood, feelings of depression Side effects that usually do not require medical attention (report to your careteam if they continue or are bothersome): Change in appetite or weight Change in sex drive or performance Constipation Dizziness Drowsiness Dry mouth Tremors This list may not describe all possible side effects. Call your doctor for medical advice about side effects. You may report side effects to FDA at1-800-FDA-1088. Where should I keep my medication? Keep out of the reach of children and pets. Store at room temperature between 20 and 25 degrees C (68 and 77 degrees F).Throw away  any unused medication after the expiration date. NOTE: This sheet is a summary. It may not cover all possible information. If you have questions about this medicine, talk to your doctor, pharmacist, orhealth care provider.  2022 Elsevier/Gold Standard (2020-06-12 13:09:40)

## 2020-11-11 NOTE — Progress Notes (Addendum)
[facility]    Provider:  Dr Lucia Gaskins Requesting Provider: Venita Lick, MD Primary Care Provider:  Patient, No Pcp Per (Inactive)  CC:  neck pain and occipital neuralgia  HPI:  Amber Vazquez is a 45 y.o. female here as requested by Venita Lick, MD for migraines. She just had occipital procedure yesterday. She has had more and more headaches for 2 years, never had them before, no family history of migraines. She has pain in the back of her head radiating from the occipital area (which is likely due to her occipital and neck issues as being treated by Dr. Debria Garret). They are stabbing, the blocks and procedures at Dr. Shon Baton' office help. She gets light and sound sensitivity, no nausea, no vomiting, not pulsing or pouding, bilateral. Starts in the occipital area and radiates to the top of the head and sides. Severe, they last for weeks on and off at least 12 hours, she goes to bed hurting and wakes up hurting, definitely stemming from the neck and occipital area. She never had headaches. She had one migraine in 2014.   Reviewed notes, labs and imaging from outside physicians, which showed:  I reviewed Dr. Venita Lick notes, she has longstanding neck pain with upper extremity dysesthesias, on exam she demonstrates no evidence of focal neurologic deficits, normal motor testing no evidence of myelopathy, her primary complaints are occipital headaches, imaging studies confirm multilevel degenerative cervical disease with loss of normal lordosis and moderate stenosis both centrally and in the foramen, patient's been to physical therapy, physical therapy only made things worse, he recommended a cervical epidural steroid injection as well as greater occipital nerve root block and sending her for migraine headaches, if she fails to improve or has worsening of her symptoms we may need to consider surgical intervention.  I reviewed his physical and neurologic exam which were both normal.  Sed  rate and CRP in August 2021 were normal.  From a review of epic, she also had left shoulder arthroscopy with repair and loose body removal in September 2016.  From a thorough review of records, medications tried that can be used in migraine management include: Naproxen, prednisone, tramadol, metoprolol, aspirin, meloxicam, Zofran. Has not tried nortriptyline, gabapentin, lyrica or topiramate.   Review of Systems: Patient complains of symptoms per HPI as well as the following symptoms neck pain.  Pertinent negatives and positives per HPI. All others negative.   Social History   Socioeconomic History   Marital status: Married    Spouse name: Not on file   Number of children: 2   Years of education: Not on file   Highest education level: Not on file  Occupational History   Not on file  Tobacco Use   Smoking status: Every Day    Packs/day: 0.20    Years: 20.00    Pack years: 4.00    Types: Cigarettes   Smokeless tobacco: Never  Vaping Use   Vaping Use: Never used  Substance and Sexual Activity   Alcohol use: Yes    Comment: Occasional   Drug use: No   Sexual activity: Not on file  Other Topics Concern   Not on file  Social History Narrative   Lives at home with spouse and 1 child   Right handed   Caffeine: coffee, 2 cups each morning    Social Determinants of Health   Financial Resource Strain: Not on file  Food Insecurity: Not on file  Transportation Needs: Not on file  Physical  Activity: Not on file  Stress: Not on file  Social Connections: Not on file  Intimate Partner Violence: Not on file    Family History  Problem Relation Age of Onset   Parkinson's disease Mother    Migraines Neg Hx     Past Medical History:  Diagnosis Date   Anxiety    Panic attacks   Cervical spinal stenosis    Cervical spondylosis    Complication of anesthesia    "fight like a mad cat when waking up"   DDD (degenerative disc disease), cervical    Dysrhythmia    Hx OF afib    History of kidney stones    Osteoarthritis    Shortness of breath dyspnea    hx of Afib- 2014    Patient Active Problem List   Diagnosis Date Noted   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     Past Surgical History:  Procedure Laterality Date   CARDIAC ELECTROPHYSIOLOGY STUDY AND ABLATION     CYSTOSCOPY  01/28/2013   Kidney stones x 3 1999, 2006   SHOULDER ARTHROSCOPY WITH BANKART REPAIR Left 01/29/2015   Procedure: LEFT SHOULDER ARTHROSCOPY WITH Nira Conn REPAIR, LOOSE BODY REMOVAL;  Surgeon: Francena Hanly, MD;  Location: [facility] OR;  Service: Orthopedics;  Laterality: Left;   shoulder surgery arthro Right    TOTAL HIP ARTHROPLASTY Right    TUBAL LIGATION  05/30/1997    Current Outpatient Medications  Medication Sig Dispense Refill   amitriptyline (ELAVIL) 25 MG tablet Take 1 tablet (25 mg total) by mouth at bedtime. 30 tablet 3   gabapentin (NEURONTIN) 300 MG capsule Take 1 capsule (300 mg total) by mouth 3 (three) times daily as needed. 90 capsule 6   meloxicam (MOBIC) 15 MG tablet Take 15 mg by mouth daily.     No current facility-administered medications for this visit.    Allergies as of 11/11/2020 - Review Complete 11/11/2020  Allergen Reaction Noted   Prednisone Nausea And Vomiting and Other (See Comments) 01/22/2015    Vitals: BP 135/75 (BP Location: Left Arm, Patient Position: Sitting)   Pulse 85   Ht 5\' 2"  (1.575 m)   Wt 180 lb (81.6 kg)   SpO2 100%   BMI 32.92 kg/m  Last Weight:  Wt Readings from Last 1 Encounters:  11/11/20 180 lb (81.6 kg)   Last Height:   Ht Readings from Last 1 Encounters:  11/11/20 5\' 2"  (1.575 m)     Physical exam: Exam: Gen: NAD, conversant, well nourised, obese, well groomed                     CV: RRR, no MRG. No Carotid Bruits. No peripheral edema, warm, nontender Eyes: Conjunctivae clear without exudates or hemorrhage  Neuro: Detailed Neurologic Exam  Speech:    Speech is normal; fluent and spontaneous with normal  comprehension.  Cognition:    The patient is oriented to person, place, and time;     recent and remote memory intact;     language fluent;     normal attention, concentration,     fund of knowledge Cranial Nerves:    The pupils are equal, round, and reactive to light. The fundi are flat. Visual fields are full to finger confrontation. Extraocular movements are intact. Trigeminal sensation is intact and the muscles of mastication are normal. The face is symmetric. The palate elevates in the midline. Hearing intact. Voice is normal. Shoulder shrug is normal. The tongue has normal motion without fasciculations.  Coordination:    Normal   Gait:    normal.   Motor Observation:    No asymmetry, no atrophy, and no involuntary movements noted. Tone:    Normal muscle tone.    Posture:    Posture is normal. normal erect    Strength:    Strength is V/V in the upper and lower limbs.      Sensation: intact to LT     Reflex Exam:  DTR's:    Deep tendon reflexes in the upper and lower extremities are normal bilaterally.   Toes:    The toes are downgoing bilaterally.   Clonus:    Clonus is absent.    Assessment/Plan:  45 year old patient with neck pain, cervico-occipital neck pain and occipital neuralgia. Neuro exam is normal. Cervical  imaging studies confirm multilevel degenerative cervical disease with loss of normal lordosis and moderate stenosis both centrally and in the foramen, patient's been to physical therapy, physical therapy only made things worse, She is ongoing cervical epidural steroid injection as well as greater occipital nerve root blocks and Dr. Shon Baton has discussed consider surgical intervention.   - Not migrainous, not pounding or throbbing, no nausea, no significant photo/phonophobia, likely etiology is occipital nerve due to cervical disk disease. - Can try Amitriptyline at bedtime and gabapentin prn during the day. Next could try Lyrica or Topiramate. The CGRP  injections are only approved for migraines however maybe they would work in other headache syndrome so can consider that in the future as well. - Discussed other options like RFA occipital nerve, refer to [facility] for evaluation, I believe this is Dr. Tempie Donning at [facility] - labs today - I recommended MRI of the brain, she declined.   From a thorough review of records, medications tried that can be used in migraine management include: Naproxen, prednisone, tramadol, metoprolol, aspirin, meloxicam, Zofran. Has not tried nortriptyline/amitriptylibe, gabapentin, lyrica or topiramate. Can try Lyrica next or Topiramate.    Orders Placed This Encounter  Procedures   Comprehensive metabolic panel   CBC with Differential/Platelets   TSH   Ambulatory referral to Neurosurgery   Meds ordered this encounter  Medications   amitriptyline (ELAVIL) 25 MG tablet    Sig: Take 1 tablet (25 mg total) by mouth at bedtime.    Dispense:  30 tablet    Refill:  3   gabapentin (NEURONTIN) 300 MG capsule    Sig: Take 1 capsule (300 mg total) by mouth 3 (three) times daily as needed.    Dispense:  90 capsule    Refill:  6    Cc: Venita Lick, MD,  Patient, No Pcp Per (Inactive)  Naomie Dean, MD  [facility] [address] [place], [place] [address]  Phone [phone] Fax [phone]

## 2020-11-26 NOTE — Addendum Note (Signed)
Addended by: Naomie Dean B on: 11/26/2020 05:06 PM   Modules accepted: Orders

## 2020-12-01 NOTE — Telephone Encounter (Signed)
I called [facility] @ [phone] and spoke with Judeth Cornfield regarding placing the referral. She advised me to fax everything to [phone]. They will call patient to schedule.

## 2020-12-06 ENCOUNTER — Other Ambulatory Visit: Payer: Self-pay | Admitting: Neurology

## 2021-02-10 ENCOUNTER — Ambulatory Visit: Payer: BLUE CROSS/BLUE SHIELD | Admitting: Adult Health

## 2021-04-29 HISTORY — PX: LAPAROSCOPIC TOTAL HYSTERECTOMY: SUR800

## 2021-06-09 ENCOUNTER — Ambulatory Visit: Payer: BLUE CROSS/BLUE SHIELD | Admitting: Adult Health

## 2021-07-30 ENCOUNTER — Other Ambulatory Visit: Payer: Self-pay | Admitting: Neurology

## 2021-11-17 ENCOUNTER — Ambulatory Visit: Payer: BLUE CROSS/BLUE SHIELD | Admitting: Adult Health

## 2021-11-17 ENCOUNTER — Encounter: Payer: Self-pay | Admitting: Adult Health

## 2021-11-17 VITALS — BP 128/86 | HR 87 | Ht 61.0 in | Wt 180.2 lb

## 2021-11-17 DIAGNOSIS — M5481 Occipital neuralgia: Secondary | ICD-10-CM | POA: Diagnosis not present

## 2021-11-17 MED ORDER — GABAPENTIN 300 MG PO CAPS: ORAL_CAPSULE | ORAL | 5 refills | Status: AC

## 2021-11-17 NOTE — Progress Notes (Signed)
PATIENT: Amber Vazquez DOB: [birth year]  REASON FOR VISIT: follow up HISTORY FROM: patient PRIMARY NEUROLOGIST: Dr. Lucia Gaskins  Chief Complaint  Patient presents with   Cervico-occipital neuralgia    Rm 19, one year FU "stopped amitriptyline d/t side effects, seeing Dr Ethelene Hal for pain management"     HISTORY OF PRESENT ILLNESS: Today 11/17/21: Amber Vazquez is a 46 year old female with a history of neck pain and headaches.  She returns today for follow-up.Reports that she has not had a headache in 1.5 weeks. Has a headache 1 every 2 weeks.  But also reports that when she gets a headache it can last up to 2 weeks? headache starts in the neck and then is all over. No light or noise sensitivity. No Nausea or vomiting. Gabapentin dose helps ease the pain.  She takes 300 mg consistently at bedtime she will take an extra 1 to 2 tablets daily if she has a headache.  But is not consistent with this.   stopped amitriptyline- had vivid dreams and had to come off it. She is taking 1 tablet of gabapentin at bedtime and occasionally during the day if needed.  HISTORY HPI:  Amber Vazquez is a 46 y.o. female here as requested by Venita Lick, MD for migraines. She just had occipital procedure yesterday. She has had more and more headaches for 2 years, never had them before, no family history of migraines. She has pain in the back of her head radiating from the occipital area (which is likely due to her occipital and neck issues as being treated by Dr. Debria Garret). They are stabbing, the blocks and procedures at Dr. Shon Baton' office help. She gets light and sound sensitivity, no nausea, no vomiting, not pulsing or pouding, bilateral. Starts in the occipital area and radiates to the top of the head and sides. Severe, they last for weeks on and off at least 12 hours, she goes to bed hurting and wakes up hurting, definitely stemming from the neck and occipital area. She never had headaches. She had one migraine in 2014.    Reviewed  notes, labs and imaging from outside physicians, which showed:   I reviewed Dr. Venita Lick notes, she has longstanding neck pain with upper extremity dysesthesias, on exam she demonstrates no evidence of focal neurologic deficits, normal motor testing no evidence of myelopathy, her primary complaints are occipital headaches, imaging studies confirm multilevel degenerative cervical disease with loss of normal lordosis and moderate stenosis both centrally and in the foramen, patient's been to physical therapy, physical therapy only made things worse, he recommended a cervical epidural steroid injection as well as greater occipital nerve root block and sending her for migraine headaches, if she fails to improve or has worsening of her symptoms we may need to consider surgical intervention.  I reviewed his physical and neurologic exam which were both normal.   Sed rate and CRP in August 2021 were normal.  From a review of epic, she also had left shoulder arthroscopy with repair and loose body removal in September 2016.   From a thorough review of records, medications tried that can be used in migraine management include: Naproxen, prednisone, tramadol, metoprolol, aspirin, meloxicam, Zofran. Has not tried nortriptyline, gabapentin, lyrica or topiramate.  REVIEW OF SYSTEMS: Out of a complete 14 system review of symptoms, the patient complains only of the following symptoms, and all other reviewed systems are negative.  ALLERGIES: Allergies  Allergen Reactions   Prednisone Nausea And Vomiting and Other (See Comments)  Rapid heart beat Update 11/11/20 patient now states she can tolerate the steroids    HOME MEDICATIONS: Outpatient Medications Prior to Visit  Medication Sig Dispense Refill   diclofenac Sodium (VOLTAREN) 1 % GEL diclofenac 1 % topical gel  APPLY 2 GRAM TO THE AFFECTED AREA(S) BY TOPICAL ROUTE 4 TIMES PER DAY     gabapentin (NEURONTIN) 300 MG capsule TAKE 1 CAPSULE BY MOUTH 3 TIMES  DAILY AS NEEDED. 90 capsule 2   gabapentin (NEURONTIN) 300 MG capsule Take by mouth as needed.     HYDROcodone-acetaminophen (NORCO) 10-325 MG tablet Take 1 tablet by mouth 3 (three) times daily as needed. Takes 1/2     meloxicam (MOBIC) 15 MG tablet Take 15 mg by mouth daily.     amitriptyline (ELAVIL) 25 MG tablet TAKE 1 TABLET BY MOUTH EVERYDAY AT BEDTIME 90 tablet 0   No facility-administered medications prior to visit.    PAST MEDICAL HISTORY: Past Medical History:  Diagnosis Date   Anxiety    Panic attacks   Cervical spinal stenosis    Cervical spondylosis    Complication of anesthesia    "fight like a mad cat when waking up"   DDD (degenerative disc disease), cervical    Dysrhythmia    Hx OF afib   History of kidney stones    Osteoarthritis    Shortness of breath dyspnea    hx of Afib- 2014    PAST SURGICAL HISTORY: Past Surgical History:  Procedure Laterality Date   CARDIAC ELECTROPHYSIOLOGY STUDY AND ABLATION     CYSTOSCOPY  01/28/2013   Kidney stones x 3 1999, 2006   LAPAROSCOPIC TOTAL HYSTERECTOMY  04/2021   SHOULDER ARTHROSCOPY WITH BANKART REPAIR Left 01/29/2015   Procedure: LEFT SHOULDER ARTHROSCOPY WITH Nira Conn REPAIR, LOOSE BODY REMOVAL;  Surgeon: Francena Hanly, MD;  Location: [facility] OR;  Service: Orthopedics;  Laterality: Left;   shoulder surgery arthro Right    TOTAL HIP ARTHROPLASTY Right    TUBAL LIGATION  05/30/1997    FAMILY HISTORY: Family History  Problem Relation Age of Onset   Parkinson's disease Mother    Migraines Neg Hx     SOCIAL HISTORY: Social History   Socioeconomic History   Marital status: Married    Spouse name: Not on file   Number of children: 2   Years of education: Not on file   Highest education level: Not on file  Occupational History   Not on file  Tobacco Use   Smoking status: Former    Packs/day: 0.20    Years: 20.00    Total pack years: 4.00    Types: Cigarettes    Quit date: 09/27/2021    Years since quitting:  0.1   Smokeless tobacco: Never  Vaping Use   Vaping Use: Never used  Substance and Sexual Activity   Alcohol use: Yes    Comment: Occasional   Drug use: No   Sexual activity: Not on file  Other Topics Concern   Not on file  Social History Narrative   Lives at home with spouse and 1 child   Right handed   Caffeine: coffee, 2 cups each morning    Social Determinants of Health   Financial Resource Strain: Not on file  Food Insecurity: Not on file  Transportation Needs: Not on file  Physical Activity: Not on file  Stress: Not on file  Social Connections: Not on file  Intimate Partner Violence: Not on file      PHYSICAL EXAM  Vitals:   11/17/21 1315  BP: 128/86  Pulse: 87  Weight: 180 lb 3.2 oz (81.7 kg)  Height: 5\' 1"  (1.549 m)   Body mass index is 34.05 kg/m.  Generalized: Well developed, in no acute distress   Neurological examination  Mentation: Alert oriented to time, place, history taking. Follows all commands speech and language fluent Cranial nerve II-XII: Pupils were equal round reactive to light. Extraocular movements were full, visual field were full on confrontational test. Facial sensation and strength were normal.  Head turning and shoulder shrug  were normal and symmetric. Motor: The motor testing reveals 5 over 5 strength of all 4 extremities. Good symmetric motor tone is noted throughout.  Sensory: Sensory testing is intact to soft touch on all 4 extremities. No evidence of extinction is noted.  Coordination: Cerebellar testing reveals good finger-nose-finger and heel-to-shin bilaterally.  Gait and station: Gait is normal.    DIAGNOSTIC DATA (LABS, IMAGING, TESTING) - I reviewed patient records, labs, notes, testing and imaging myself where available.  Lab Results  Component Value Date   WBC 8.1 01/26/2015   HGB 13.4 01/26/2015   HCT 39.4 01/26/2015   MCV 91.6 01/26/2015   PLT 227 01/26/2015      Component Value Date/Time   NA 137  01/26/2015 1546   K 4.2 01/26/2015 1546   CL 105 01/26/2015 1546   CO2 25 01/26/2015 1546   GLUCOSE 94 01/26/2015 1546   BUN 13 01/26/2015 1546   CREATININE 0.71 01/26/2015 1546   CALCIUM 9.5 01/26/2015 1546   PROT 7.0 01/26/2015 1546   ALBUMIN 4.1 01/26/2015 1546   AST 17 01/26/2015 1546   ALT 14 01/26/2015 1546   ALKPHOS 43 01/26/2015 1546   BILITOT 0.5 01/26/2015 1546   GFRNONAA >60 01/26/2015 1546   GFRAA >60 01/26/2015 1546     ASSESSMENT AND PLAN 46 y.o. year old female  has a past medical history of Anxiety, Cervical spinal stenosis, Cervical spondylosis, Complication of anesthesia, DDD (degenerative disc disease), cervical, Dysrhythmia, History of kidney stones, Osteoarthritis, and Shortness of breath dyspnea. here with:  Cervico-occipital Neuralgia  We discussed adding on a different medication such as Topamax however the patient deferred for now.   She will continue taking gabapentin 300 mg at bedtime.  I did encourage the patient that as soon as she feels discomfort in the neck or back of the head she can take gabapentin 300 mg up to 3 times a day.  Patient voiced understanding Follow-up in 6 months or sooner if needed     60, MSN, NP-C 11/17/2021, 1:32 PM [facility] [address] [place], [place] [phone]

## 2021-11-17 NOTE — Patient Instructions (Signed)
Your Plan:  Continue gabapentin  Can consider Topamax in the future   Thank you for coming to see [place] at [facility]. I hope we have been able to provide you high quality care today.  You may receive a patient satisfaction survey over the next few weeks. We would appreciate your feedback and comments so that we may continue to improve ourselves and the health of our patients.

## 2022-04-12 ENCOUNTER — Telehealth: Payer: Self-pay | Admitting: Adult Health

## 2022-04-12 NOTE — Telephone Encounter (Signed)
LVM and sent mychart msg informing pt of r/s of 12/8 appt- NP out.

## 2022-05-06 ENCOUNTER — Telehealth: Payer: BLUE CROSS/BLUE SHIELD | Admitting: Adult Health

## 2022-05-31 ENCOUNTER — Telehealth: Payer: BLUE CROSS/BLUE SHIELD | Admitting: Adult Health

## 2023-04-04 ENCOUNTER — Other Ambulatory Visit: Payer: Self-pay

## 2023-04-04 ENCOUNTER — Encounter (HOSPITAL_BASED_OUTPATIENT_CLINIC_OR_DEPARTMENT_OTHER): Payer: Self-pay | Admitting: Orthopedic Surgery

## 2023-04-12 ENCOUNTER — Other Ambulatory Visit: Payer: Self-pay

## 2023-04-12 ENCOUNTER — Ambulatory Visit (HOSPITAL_BASED_OUTPATIENT_CLINIC_OR_DEPARTMENT_OTHER): Payer: BLUE CROSS/BLUE SHIELD

## 2023-04-12 ENCOUNTER — Ambulatory Visit (HOSPITAL_BASED_OUTPATIENT_CLINIC_OR_DEPARTMENT_OTHER): Payer: Self-pay | Admitting: Anesthesiology

## 2023-04-12 ENCOUNTER — Ambulatory Visit (HOSPITAL_BASED_OUTPATIENT_CLINIC_OR_DEPARTMENT_OTHER)
Admission: RE | Admit: 2023-04-12 | Discharge: 2023-04-12 | Disposition: A | Discharge status: Home / Self Care | Payer: BLUE CROSS/BLUE SHIELD | Attending: Orthopedic Surgery | Admitting: Orthopedic Surgery

## 2023-04-12 ENCOUNTER — Encounter (HOSPITAL_BASED_OUTPATIENT_CLINIC_OR_DEPARTMENT_OTHER)
Admission: RE | Disposition: A | Discharge status: Home / Self Care | Payer: Self-pay | Source: Home / Self Care | Attending: Orthopedic Surgery

## 2023-04-12 ENCOUNTER — Encounter (HOSPITAL_BASED_OUTPATIENT_CLINIC_OR_DEPARTMENT_OTHER): Payer: Self-pay | Admitting: Orthopedic Surgery

## 2023-04-12 DIAGNOSIS — F419 Anxiety disorder, unspecified: Secondary | ICD-10-CM | POA: Diagnosis not present

## 2023-04-12 DIAGNOSIS — M1812 Unilateral primary osteoarthritis of first carpometacarpal joint, left hand: Secondary | ICD-10-CM | POA: Insufficient documentation

## 2023-04-12 DIAGNOSIS — R519 Headache, unspecified: Secondary | ICD-10-CM | POA: Insufficient documentation

## 2023-04-12 DIAGNOSIS — M4802 Spinal stenosis, cervical region: Secondary | ICD-10-CM | POA: Insufficient documentation

## 2023-04-12 DIAGNOSIS — Z87891 Personal history of nicotine dependence: Secondary | ICD-10-CM | POA: Insufficient documentation

## 2023-04-12 HISTORY — PX: CARPOMETACARPEL SUSPENSION PLASTY: SHX5005

## 2023-04-12 HISTORY — PX: TENDON TRANSFER: SHX6109

## 2023-04-12 SURGERY — CARPOMETACARPEL (CMC) SUSPENSION PLASTY
Anesthesia: General | Laterality: Left

## 2023-04-12 MED ORDER — FENTANYL CITRATE (PF) 100 MCG/2ML IJ SOLN: 25.0000 ug | INTRAMUSCULAR | Status: DC | PRN

## 2023-04-12 MED ORDER — PHENYLEPHRINE 80 MCG/ML (10ML) SYRINGE FOR IV PUSH (FOR BLOOD PRESSURE SUPPORT)
PREFILLED_SYRINGE | INTRAVENOUS | Status: DC | PRN
  Administered 2023-04-12 (×2): 80 ug via INTRAVENOUS

## 2023-04-12 MED ORDER — ONDANSETRON HCL 4 MG/2ML IJ SOLN
INTRAMUSCULAR | Status: DC | PRN
  Administered 2023-04-12: 4 mg via INTRAVENOUS

## 2023-04-12 MED ORDER — FENTANYL CITRATE (PF) 100 MCG/2ML IJ SOLN
INTRAMUSCULAR | Status: AC
  Filled 2023-04-12: qty 2

## 2023-04-12 MED ORDER — 0.9 % SODIUM CHLORIDE (POUR BTL) OPTIME
TOPICAL | Status: DC | PRN
  Administered 2023-04-12: 1000 mL

## 2023-04-12 MED ORDER — FENTANYL CITRATE (PF) 100 MCG/2ML IJ SOLN
100.0000 ug | Freq: Once | INTRAMUSCULAR | Status: AC
  Administered 2023-04-12: 50 ug via INTRAVENOUS

## 2023-04-12 MED ORDER — ACETAMINOPHEN 500 MG PO TABS
1000.0000 mg | ORAL_TABLET | Freq: Once | ORAL | Status: AC
  Administered 2023-04-12: 1000 mg via ORAL

## 2023-04-12 MED ORDER — CEFAZOLIN SODIUM-DEXTROSE 2-4 GM/100ML-% IV SOLN
INTRAVENOUS | Status: AC
  Filled 2023-04-12: qty 100

## 2023-04-12 MED ORDER — AMISULPRIDE (ANTIEMETIC) 5 MG/2ML IV SOLN: 10.0000 mg | Freq: Once | INTRAVENOUS | Status: DC | PRN

## 2023-04-12 MED ORDER — OXYCODONE HCL 5 MG PO TABS: 5.0000 mg | ORAL_TABLET | Freq: Once | ORAL | Status: DC | PRN

## 2023-04-12 MED ORDER — MIDAZOLAM HCL 2 MG/2ML IJ SOLN
INTRAMUSCULAR | Status: AC
  Filled 2023-04-12: qty 2

## 2023-04-12 MED ORDER — OXYCODONE HCL 5 MG/5ML PO SOLN: 5.0000 mg | Freq: Once | ORAL | Status: DC | PRN

## 2023-04-12 MED ORDER — MIDAZOLAM HCL 2 MG/2ML IJ SOLN
2.0000 mg | Freq: Once | INTRAMUSCULAR | Status: AC
  Administered 2023-04-12: 2 mg via INTRAVENOUS

## 2023-04-12 MED ORDER — OXYCODONE HCL 5 MG PO TABS: 5.0000 mg | ORAL_TABLET | Freq: Four times a day (QID) | ORAL | 0 refills | Status: AC | PRN

## 2023-04-12 MED ORDER — ROPIVACAINE HCL 5 MG/ML IJ SOLN
INTRAMUSCULAR | Status: DC | PRN
  Administered 2023-04-12: 30 mL via PERINEURAL

## 2023-04-12 MED ORDER — CEFAZOLIN SODIUM-DEXTROSE 2-4 GM/100ML-% IV SOLN
2.0000 g | INTRAVENOUS | Status: AC
  Administered 2023-04-12: 2 g via INTRAVENOUS

## 2023-04-12 MED ORDER — FENTANYL CITRATE (PF) 100 MCG/2ML IJ SOLN
INTRAMUSCULAR | Status: DC | PRN
  Administered 2023-04-12 (×2): 50 ug via INTRAVENOUS

## 2023-04-12 MED ORDER — MIDAZOLAM HCL 2 MG/2ML IJ SOLN
INTRAMUSCULAR | Status: DC | PRN
  Administered 2023-04-12: 2 mg via INTRAVENOUS

## 2023-04-12 MED ORDER — ACETAMINOPHEN 500 MG PO TABS
ORAL_TABLET | ORAL | Status: AC
  Filled 2023-04-12: qty 2

## 2023-04-12 MED ORDER — PROPOFOL 500 MG/50ML IV EMUL
INTRAVENOUS | Status: DC | PRN
  Administered 2023-04-12: 125 ug/kg/min via INTRAVENOUS

## 2023-04-12 MED ORDER — LACTATED RINGERS IV SOLN: INTRAVENOUS | Status: DC

## 2023-04-12 SURGICAL SUPPLY — 56 items
ANCHOR FIBERLOCK SUSPENSION (Anchor) IMPLANT
ANCHOR FT CORKSCREW MICRO 2-0 (Anchor) IMPLANT
BLADE MINI RND TIP GREEN BEAV (BLADE) IMPLANT
BLADE SURG 15 STRL LF DISP TIS (BLADE) ×2 IMPLANT
BLADE SURG 15 STRL SS (BLADE) ×3
BNDG ELASTIC 3INX 5YD STR LF (GAUZE/BANDAGES/DRESSINGS) IMPLANT
BNDG ELASTIC 4INX 5YD STR LF (GAUZE/BANDAGES/DRESSINGS) ×1 IMPLANT
BNDG ESMARK 4X9 LF (GAUZE/BANDAGES/DRESSINGS) ×1 IMPLANT
BNDG GAUZE DERMACEA FLUFF 4 (GAUZE/BANDAGES/DRESSINGS) ×1 IMPLANT
BNDG PLASTER X FAST 3X3 WHT LF (CAST SUPPLIES) IMPLANT
BNDG PLASTER X FAST 4X5 WHT LF (CAST SUPPLIES) IMPLANT
CHLORAPREP W/TINT 26 (MISCELLANEOUS) ×1 IMPLANT
CORD BIPOLAR FORCEPS 12FT (ELECTRODE) ×1 IMPLANT
COVER BACK TABLE 60X90IN (DRAPES) ×1 IMPLANT
COVER MAYO STAND STRL (DRAPES) ×1 IMPLANT
CUFF TOURN SGL QUICK 18X4 (TOURNIQUET CUFF) IMPLANT
CUFF TOURN SGL QUICK 24 (TOURNIQUET CUFF)
CUFF TRNQT CYL 24X4X16.5-23 (TOURNIQUET CUFF) IMPLANT
DERMABOND ADVANCED .7 DNX12 (GAUZE/BANDAGES/DRESSINGS) IMPLANT
DRAPE EXTREMITY T 121X128X90 (DISPOSABLE) ×1 IMPLANT
DRAPE OEC MINIVIEW 54X84 (DRAPES) ×1 IMPLANT
DRAPE SURG 17X23 STRL (DRAPES) ×1 IMPLANT
GAUZE SPONGE 4X4 12PLY STRL (GAUZE/BANDAGES/DRESSINGS) ×1 IMPLANT
GAUZE XEROFORM 1X8 LF (GAUZE/BANDAGES/DRESSINGS) ×1 IMPLANT
GLOVE BIO SURGEON STRL SZ7 (GLOVE) ×1 IMPLANT
GLOVE BIOGEL PI IND STRL 7.0 (GLOVE) ×1 IMPLANT
GOWN STRL REUS W/ TWL LRG LVL3 (GOWN DISPOSABLE) ×2 IMPLANT
GOWN STRL REUS W/TWL LRG LVL3 (GOWN DISPOSABLE) ×2
K-WIRE .045X9 SGL TROCAR (WIRE) ×1
K-WIRE DBL .045X4 NSTRL (WIRE)
KWIRE .045X9 SGL TROCAR (WIRE) IMPLANT
KWIRE DBL .045X4 NSTRL (WIRE) IMPLANT
NDL HYPO 25X1 1.5 SAFETY (NEEDLE) IMPLANT
NEEDLE HYPO 25X1 1.5 SAFETY (NEEDLE)
NS IRRIG 1000ML POUR BTL (IV SOLUTION) ×1 IMPLANT
PACK BASIN DAY SURGERY FS (CUSTOM PROCEDURE TRAY) ×1 IMPLANT
PAD CAST 3X4 CTTN HI CHSV (CAST SUPPLIES) ×1 IMPLANT
PADDING CAST COTTON 3X4 STRL (CAST SUPPLIES) ×1
SLEEVE SCD COMPRESS KNEE MED (STOCKING) IMPLANT
SLING ARM FOAM STRAP LRG (SOFTGOODS) IMPLANT
SLING ARM FOAM STRAP MED (SOFTGOODS) IMPLANT
SPLINT FIBERGLASS 4X30 (CAST SUPPLIES) ×1 IMPLANT
STRIP CLOSURE SKIN 1/2X4 (GAUZE/BANDAGES/DRESSINGS) IMPLANT
SUCTION TUBE FRAZIER 10FR DISP (SUCTIONS) IMPLANT
SUT ETHIBOND 3-0 V-5 (SUTURE) IMPLANT
SUT ETHILON 4 0 PS 2 18 (SUTURE) ×1 IMPLANT
SUT MERSILENE 4 0 P 3 (SUTURE) ×1 IMPLANT
SUT MNCRL AB 3-0 PS2 18 (SUTURE) IMPLANT
SUT MNCRL AB 4-0 PS2 18 (SUTURE) IMPLANT
SUT SUPRAMID 3-0 (SUTURE) IMPLANT
SUT VIC AB 4-0 PS2 18 (SUTURE) IMPLANT
SYR BULB EAR ULCER 3OZ GRN STR (SYRINGE) ×1 IMPLANT
SYR CONTROL 10ML LL (SYRINGE) IMPLANT
TOWEL GREEN STERILE FF (TOWEL DISPOSABLE) ×2 IMPLANT
TUBE CONNECTING 20X1/4 (TUBING) IMPLANT
UNDERPAD 30X36 HEAVY ABSORB (UNDERPADS AND DIAPERS) ×1 IMPLANT

## 2023-04-12 NOTE — Anesthesia Procedure Notes (Signed)
Anesthesia Regional Block: Supraclavicular block   Pre-Anesthetic Checklist: , timeout performed,  Correct Patient, Correct Site, Correct Laterality,  Correct Procedure, Correct Position, site marked,  Risks and benefits discussed,  Surgical consent,  Pre-op evaluation,  At surgeon's request and post-op pain management  Laterality: Left  Prep: chloraprep       Needles:  Injection technique: Single-shot  Needle Type: Echogenic Stimulator Needle     Needle Length: 9cm  Needle Gauge: 21     Additional Needles:   Procedures:,,,, ultrasound used (permanent image in chart),,    Narrative:  Start time: 04/12/2023 10:47 AM End time: 04/12/2023 10:50 AM Injection made incrementally with aspirations every 5 mL.  Performed by: Personally  Anesthesiologist: Linton Rump, MD  Additional Notes: Discussed risks and benefits of nerve block including, but not limited to, prolonged and/or permanent nerve injury involving sensory and/or motor function. Monitors were applied and a time-out was performed. The nerve and associated structures were visualized under ultrasound guidance. After negative aspiration, local anesthetic was slowly injected around the nerve. There was no evidence of high pressure during the procedure. There were no paresthesias. VSS remained stable and the patient tolerated the procedure well.

## 2023-04-12 NOTE — Interval H&P Note (Signed)
History and Physical Interval Note:  04/12/2023 10:45 AM  Amber Vazquez  has presented today for surgery, with the diagnosis of Left thumb carpometacarpal osteoarthritis with metacarpal phalangeal hyper extension.  The various methods of treatment have been discussed with the patient and family. After consideration of risks, benefits and other options for treatment, the patient has consented to  Procedure(s) with comments: TRAPEZIECTOMY WITH  SUSPENSION PLASTY (Left) - regional block 120 TENDON TRANSFER WITH VOLAR PLATE ADVANCEMENT (Left) - regional block 120 as a surgical intervention.  The patient's history has been reviewed, patient examined, no change in status, stable for surgery.  I have reviewed the patient's chart and labs.  Questions were answered to the patient's satisfaction.     Vivianne Carles

## 2023-04-12 NOTE — Transfer of Care (Signed)
Immediate Anesthesia Transfer of Care Note  Patient: Amber Vazquez  Procedure(s) Performed: TRAPEZIECTOMY WITH  SUSPENSION PLASTY (Left) TENDON TRANSFER WITH VOLAR PLATE ADVANCEMENT (Left)  Patient Location: PACU  Anesthesia Type:MAC  Level of Consciousness: awake  Airway & Oxygen Therapy: Patient Spontanous Breathing  Post-op Assessment: Post -op Vital signs reviewed and stable  Post vital signs: stable  Last Vitals:  Vitals Value Taken Time  BP 101/62 04/12/23 1424  Temp    Pulse 84 04/12/23 1428  Resp 20 04/12/23 1428  SpO2 96 % 04/12/23 1428  Vitals shown include unfiled device data.  Last Pain:  Vitals:   04/12/23 0953  TempSrc: Temporal  PainSc:       Patients Stated Pain Goal: 3 (04/12/23 0949)  Complications: There were no known notable events for this encounter.

## 2023-04-12 NOTE — Anesthesia Preprocedure Evaluation (Addendum)
Anesthesia Evaluation  Patient identified by MRN, date of birth, ID band Patient awake    Reviewed: Allergy & Precautions, NPO status , Patient's Chart, lab work & pertinent test results  History of Anesthesia Complications (+) history of anesthetic complications (wakes up fighting)  Airway Mallampati: I  TM Distance: >3 FB Neck ROM: Full    Dental  (+) Dental Advisory Given   Pulmonary neg pulmonary ROS, neg shortness of breath, neg sleep apnea, neg COPD, neg recent URI, former smoker   Pulmonary exam normal breath sounds clear to auscultation       Cardiovascular (-) hypertension(-) angina (-) Past MI, (-) Cardiac Stents and (-) CABG + dysrhythmias (h/o AF)  Rhythm:Regular Rate:Normal     Neuro/Psych  Headaches, neg Seizures PSYCHIATRIC DISORDERS Anxiety      Neuromuscular disease (cervical spine stenosis)    GI/Hepatic negative GI ROS, Neg liver ROS,,,  Endo/Other  negative endocrine ROS    Renal/GU negative Renal ROS     Musculoskeletal  (+) Arthritis , Osteoarthritis,    Abdominal   Peds  Hematology negative hematology ROS (+)   Anesthesia Other Findings   Reproductive/Obstetrics                             Anesthesia Physical Anesthesia Plan  ASA: 2  Anesthesia Plan: General   Post-op Pain Management: Tylenol PO (pre-op)*   Induction: Intravenous  PONV Risk Score and Plan: 3 and Ondansetron, Dexamethasone, Treatment may vary due to age or medical condition, Propofol infusion and TIVA  Airway Management Planned: Natural Airway and Simple Face Mask  Additional Equipment:   Intra-op Plan:   Post-operative Plan: Extubation in OR  Informed Consent: I have reviewed the patients History and Physical, chart, labs and discussed the procedure including the risks, benefits and alternatives for the proposed anesthesia with the patient or authorized representative who has indicated  his/her understanding and acceptance.     Dental advisory given  Plan Discussed with: CRNA and Anesthesiologist  Anesthesia Plan Comments: (Discussed potential risks of nerve blocks including, but not limited to, infection, bleeding, nerve damage, seizures, pneumothorax, respiratory depression, and potential failure of the block. Alternatives to nerve blocks discussed. All questions answered.  Discussed with patient risks of MAC including, but not limited to, minor pain or discomfort, hearing people in the room, and possible need for backup general anesthesia. Risks for general anesthesia also discussed including, but not limited to, sore throat, hoarse voice, chipped/damaged teeth, injury to vocal cords, nausea and vomiting, allergic reactions, lung infection, heart attack, stroke, and death. All questions answered. )        Anesthesia Quick Evaluation

## 2023-04-12 NOTE — H&P (Signed)
HAND SURGERY   HPI: Patient is a 47 y.o. 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  She has failed conservative management with bracing, activity modification, oral anti-inflammatory medications, and corticosteroid injection.  She presents today for surgical treatment..  Patient denies any changes to their medical history or new systemic symptoms today.    Past Medical History:  Diagnosis Date   Anxiety    Panic attacks   Cervical spinal stenosis    Cervical spondylosis    Complication of anesthesia    "fight like a mad cat when waking up"   DDD (degenerative disc disease), cervical    Dysrhythmia    Hx OF afib   History of kidney stones    Osteoarthritis    Shortness of breath dyspnea    hx of Afib- 2014   Past Surgical History:  Procedure Laterality Date   CARDIAC ELECTROPHYSIOLOGY STUDY AND ABLATION     CYSTOSCOPY  01/28/2013   Kidney stones x 3 1999, 2006   LAPAROSCOPIC TOTAL HYSTERECTOMY  04/2021   SHOULDER ARTHROSCOPY WITH BANKART REPAIR Left 01/29/2015   Procedure: LEFT SHOULDER ARTHROSCOPY WITH Nira Conn REPAIR, LOOSE BODY REMOVAL;  Surgeon: Francena Hanly, MD;  Location: [facility] OR;  Service: Orthopedics;  Laterality: Left;   shoulder surgery arthro Right    TOTAL HIP ARTHROPLASTY Right    TUBAL LIGATION  05/30/1997   Social History   Socioeconomic History   Marital status: Married    Spouse name: Not on file   Number of children: 2   Years of education: Not on file   Highest education level: Not on file  Occupational History   Not on file  Tobacco Use   Smoking status: Former    Current packs/day: 0.00    Average packs/day: 0.2 packs/day for 20.0 years (4.0 ttl pk-yrs)    Types: Cigarettes    Start date: 09/27/2001    Quit date: 09/27/2021    Years since quitting: 1.5   Smokeless tobacco: Never  Vaping Use   Vaping status: Never Used  Substance and Sexual Activity    Alcohol use: Yes    Comment: Occasional   Drug use: No   Sexual activity: Not on file  Other Topics Concern   Not on file  Social History Narrative   Lives at home with spouse and 1 child   Right handed   Caffeine: coffee, 2 cups each morning    Social Determinants of Health   Financial Resource Strain: Low Risk  (12/14/2020)   [facility] from [facility] visits prior to 07/30/2022., [facility] visits prior to 07/30/2022.   Overall Financial Resource Strain (CARDIA)    Difficulty of Paying Living Expenses: Not hard at all  Food Insecurity: No Food Insecurity (12/14/2020)   [facility] from [facility] visits prior to 07/30/2022., [facility] visits prior to 07/30/2022.   Hunger Vital Sign    Worried About Running Out of Food in the Last Year: Never true    Ran Out of Food in the Last Year: Never true  Transportation Needs: No Transportation Needs (12/14/2020)   Received from [facility] visits prior to 07/30/2022., [facility] visits prior to 07/30/2022.   PRAPARE - Administrator, Civil Service (Medical): No    Lack of Transportation (Non-Medical): No  Physical Activity: Sufficiently Active (12/14/2020)  [facility] prior to 07/30/2022., [facility] visits prior to 07/30/2022.   Exercise Vital Sign    Days of Exercise per Week: 5 days    Minutes of Exercise per Session: 90 min  Stress: No Stress Concern Present (12/14/2020)   [facility] prior to 07/30/2022., [facility] visits prior to 07/30/2022.   [employer] of Occupational Health - Occupational Stress Questionnaire    Feeling of Stress : Not at all  Social Connections: Unknown (05/05/2022)   Received from [facility]   Social Network    Social Network: Not on  file   Family History  Problem Relation Age of Onset   Parkinson's disease Mother    Migraines Neg Hx    - negative except otherwise stated in the family history section Allergies  Allergen Reactions   Prednisone Nausea And Vomiting and Other (See Comments)    Rapid heart beat Update 11/11/20 patient now states she can tolerate the steroids   Prior to Admission medications   Medication Sig Start Date End Date Taking? Authorizing Provider  acetaminophen (TYLENOL) 325 MG tablet Take 650 mg by mouth every 6 (six) hours as needed for mild pain (pain score 1-3).   Yes [provider]  gabapentin (NEURONTIN) 300 MG capsule TAKE 1 CAPSULE BY MOUTH 3 TIMES DAILY AS NEEDED. 11/17/21  Yes Butch Penny, NP  oxycodone (OXY-IR) 5 MG capsule Take 5 mg by mouth every 6 (six) hours as needed for pain.   Yes [provider]  diclofenac Sodium (VOLTAREN) 1 % GEL diclofenac 1 % topical gel  APPLY 2 GRAM TO THE AFFECTED AREA(S) BY TOPICAL ROUTE 4 TIMES PER DAY    [provider]   No results found. - Positive ROS: All other systems have been reviewed and were otherwise negative with the exception of those mentioned in the HPI and as above.  Physical Exam: General: No acute distress, resting comfortably Cardiovascular: BUE warm and well perfused, normal rate Respiratory: Normal WOB on RA Skin: Warm and dry Neurologic: Sensation intact distally Psychiatric: Patient is at baseline mood and affect  Left upper Extremity  Pain and crepitus with CMC grind test.  She has static MCP hyperextension of approximately 60 degrees with dynamic hyperextension of approximately 45 degrees.  She has no pain with manipulation or active/passive range of motion of the MCP joint.  She has full and painless range of motion of the remaining fingers.  Sensations intact light touch in the median, ulnar, radial nerve distributions.  Her hand is warm and well-perfused with brisk capillary  fill.  Assessment: 47 year old female with left thumb CMC instability and underlying osteoarthritis with accompanying static/dynamic MCP hyperextension without degenerative change.  Plan: OR today for left thumb trapeziectomy with suspension plasty and possible tendon transfer with MCP percutaneous pinning and volar capsulodesis to address her hyperextension.. We again reviewed the risks of surgery which include, but are not limited to, bleeding, infection, damage to neurovascular structures, persistent symptoms, stiffness, persistent thumb instability, persistent thumb MCP hyperextension, persistent pain, decreased strength, need for additional surgery.  Informed consent was signed.  All questions were answered.   Marlyne Beards, M.D. [facility] 10:42 AM

## 2023-04-12 NOTE — Progress Notes (Signed)
Assisted Dr. Neoma Laming with left, supraclavicular, ultrasound guided block. Side rails up, monitors on throughout procedure. See vital signs in flow sheet. Tolerated Procedure well.

## 2023-04-12 NOTE — Anesthesia Postprocedure Evaluation (Signed)
Anesthesia Post Note  Patient: Amber Vazquez  Procedure(s) Performed: TRAPEZIECTOMY WITH  SUSPENSION PLASTY (Left) TENDON TRANSFER WITH VOLAR PLATE ADVANCEMENT (Left)     Patient location during evaluation: PACU Anesthesia Type: Regional and MAC Level of consciousness: awake Pain management: pain level controlled Vital Signs Assessment: post-procedure vital signs reviewed and stable Respiratory status: spontaneous breathing, nonlabored ventilation and respiratory function stable Cardiovascular status: stable and blood pressure returned to baseline Postop Assessment: no apparent nausea or vomiting Anesthetic complications: no   There were no known notable events for this encounter.  Last Vitals:  Vitals:   04/12/23 1500 04/12/23 1514  BP: (!) 89/65 98/68  Pulse: 81 78  Resp: 14 16  Temp:  36.8 C  SpO2: 92% 94%    Last Pain:  Vitals:   04/12/23 1514  TempSrc:   PainSc: 0-No pain                 Linton Rump

## 2023-04-12 NOTE — Discharge Instructions (Addendum)
May take Tylenol after 4pm if needed.   Waylan Rocher, M.D. Hand Surgery  POST-OPERATIVE DISCHARGE INSTRUCTIONS   PRESCRIPTIONS: You may have been given a prescription to be taken as directed for post-operative pain control.  You may also take over the counter ibuprofen/aleve and tylenol for pain. Take this as directed on the packaging. Do not exceed 3000 mg tylenol/acetaminophen in 24 hours.  Ibuprofen 600-800 mg (3-4) tablets by mouth every 6 hours as needed for pain.  OR Aleve 2 tablets by mouth every 12 hours (twice daily) as needed for pain.  AND/OR Tylenol 1000 mg (2 tablets) every 8 hours as needed for pain.  Please use your pain medication carefully, as refills are limited and you may not be provided with one.  As stated above, please use over the counter pain medicine - it will also be helpful with decreasing your swelling.    ANESTHESIA: After your surgery, post-surgical discomfort or pain is likely. This discomfort can last several days to a few weeks. At certain times of the day your discomfort may be more intense.   Did you receive a nerve block?  A nerve block can provide pain relief for one hour to two days after your surgery. As long as the nerve block is working, you will experience little or no sensation in the area the surgeon operated on.  As the nerve block wears off, you will begin to experience pain or discomfort. It is very important that you begin taking your prescribed pain medication before the nerve block fully wears off. Treating your pain at the first sign of the block wearing off will ensure your pain is better controlled and more tolerable when full-sensation returns. Do not wait until the pain is intolerable, as the medicine will be less effective. It is better to treat pain in advance than to try and catch up.   General Anesthesia:  If you did not receive a nerve block during your surgery, you will need to start taking your pain medication shortly  after your surgery and should continue to do so as prescribed by your surgeon.     ICE AND ELEVATION: You may use ice for the first 48-72 hours, but it is not critical.   Motion of your fingers is very important to decrease the swelling.  Elevation, as much as possible for the next 48 hours, is critical for decreasing swelling as well as for pain relief. Elevation means when you are seated or lying down, you hand should be at or above your heart. When walking, the hand needs to be at or above the level of your elbow.  If the bandage gets too tight, it may need to be loosened. Please contact our office and we will instruct you in how to do this.    SURGICAL BANDAGES:  Keep your dressing and/or splint clean and dry at all times.  Do not remove until you are seen again in the office.  If careful, you may place a plastic bag over your bandage and tape the end to shower, but be careful, do not get your bandages wet.     HAND THERAPY:  You will be contacted to set up your first visit.    ACTIVITY AND WORK: You are encouraged to move any fingers which are not in the bandage.  Light use of the fingers is allowed to assist the other hand with daily hygiene and eating, but strong gripping or lifting is often uncomfortable and should be avoided.  You might miss a variable period of time from work and hopefully this issue has been discussed prior to surgery. You may not do any heavy work with your affected hand for about 2 weeks.    [facility] Second Floor, [address] [place], [place] [address] [phone]   Regional Anesthesia Blocks  1. You may not be able to move or feel the "blocked" extremity after a regional anesthetic block. This may last may last from 3-48 hours after placement, but it will go away. The length of time depends on the medication injected and your individual response to the medication. As the nerves start to wake up, you may experience tingling as the movement  and feeling returns to your extremity. If the numbness and inability to move your extremity has not gone away after 48 hours, please call your surgeon.   2. The extremity that is blocked will need to be protected until the numbness is gone and the strength has returned. Because you cannot feel it, you will need to take extra care to avoid injury. Because it may be weak, you may have difficulty moving it or using it. You may not know what position it is in without looking at it while the block is in effect.  3. For blocks in the legs and feet, returning to weight bearing and walking needs to be done carefully. You will need to wait until the numbness is entirely gone and the strength has returned. You should be able to move your leg and foot normally before you try and bear weight or walk. You will need someone to be with you when you first try to ensure you do not fall and possibly risk injury.  4. Bruising and tenderness at the needle site are common side effects and will resolve in a few days.  5. Persistent numbness or new problems with movement should be communicated to the surgeon or the [facility] [phone] [facility] [phone]).  Post Anesthesia Home Care Instructions  Activity: Get plenty of rest for the remainder of the day. A responsible individual must stay with you for 24 hours following the procedure.  For the next 24 hours, DO NOT: -Drive a car -Advertising copywriter -Drink alcoholic beverages -Take any medication unless instructed by your physician -Make any legal decisions or sign important papers.  Meals: Start with liquid foods such as gelatin or soup. Progress to regular foods as tolerated. Avoid greasy, spicy, heavy foods. If nausea and/or vomiting occur, drink only clear liquids until the nausea and/or vomiting subsides. Call your physician if vomiting continues.  Special Instructions/Symptoms: Your throat may feel dry or sore from the  anesthesia or the breathing tube placed in your throat during surgery. If this causes discomfort, gargle with warm salt water. The discomfort should disappear within 24 hours.  If you had a scopolamine patch placed behind your ear for the management of post- operative nausea and/or vomiting:  1. The medication in the patch is effective for 72 hours, after which it should be removed.  Wrap patch in a tissue and discard in the trash. Wash hands thoroughly with soap and water. 2. You may remove the patch earlier than 72 hours if you experience unpleasant side effects which may include dry mouth, dizziness or visual disturbances. 3. Avoid touching the patch. Wash your hands with soap and water after contact with the patch.

## 2023-04-13 ENCOUNTER — Emergency Department (HOSPITAL_COMMUNITY): Payer: BLUE CROSS/BLUE SHIELD

## 2023-04-13 ENCOUNTER — Other Ambulatory Visit: Payer: Self-pay

## 2023-04-13 ENCOUNTER — Encounter (HOSPITAL_COMMUNITY): Payer: Self-pay

## 2023-04-13 ENCOUNTER — Emergency Department (HOSPITAL_COMMUNITY)
Admission: EM | Admit: 2023-04-13 | Discharge: 2023-04-13 | Disposition: A | Discharge status: Home / Self Care | Payer: BLUE CROSS/BLUE SHIELD | Attending: Emergency Medicine | Admitting: Emergency Medicine

## 2023-04-13 DIAGNOSIS — G8918 Other acute postprocedural pain: Secondary | ICD-10-CM | POA: Insufficient documentation

## 2023-04-13 MED ORDER — HYDROMORPHONE HCL 1 MG/ML IJ SOLN
1.0000 mg | Freq: Once | INTRAMUSCULAR | Status: AC
  Administered 2023-04-13: 1 mg via INTRAMUSCULAR
  Filled 2023-04-13: qty 1

## 2023-04-13 MED ORDER — KETOROLAC TROMETHAMINE 15 MG/ML IJ SOLN
15.0000 mg | Freq: Once | INTRAMUSCULAR | Status: AC
  Administered 2023-04-13: 15 mg via INTRAMUSCULAR
  Filled 2023-04-13: qty 1

## 2023-04-13 MED ORDER — KETOROLAC TROMETHAMINE 10 MG PO TABS: 10.0000 mg | ORAL_TABLET | Freq: Four times a day (QID) | ORAL | 0 refills | Status: AC | PRN

## 2023-04-13 MED ORDER — KETOROLAC TROMETHAMINE 10 MG PO TABS: 10.0000 mg | ORAL_TABLET | Freq: Four times a day (QID) | ORAL | 0 refills | Status: DC | PRN

## 2023-04-13 MED ORDER — ACETAMINOPHEN 500 MG PO TABS
1000.0000 mg | ORAL_TABLET | Freq: Once | ORAL | Status: AC
  Administered 2023-04-13: 1000 mg via ORAL
  Filled 2023-04-13: qty 2

## 2023-04-13 NOTE — ED Provider Notes (Signed)
[facility] AT [facility] Provider Note   CSN: [id number] Arrival date & time: 04/13/23  0600     History  Chief Complaint  Patient presents with   Post-op Problem    Amber Vazquez is a 47 y.o. female with medical history of DDD, cervical spondylosis, cervical spinal stenosis, chronic pain on 5 mg immediate release oxycodone tablets, gabapentin, recent trapeziectomy with Dr. Frazier Butt at [facility] on 11/13.  Patient presents to ED due to postop pain.  States that she has been taking prescribed 5 mg oxycodones at home without relief as well as ibuprofen and Tylenol.  Reports that it "does not touch" the pain.  She states she has a history of chronic pain which is also being treated at [facility].  She denies any drainage from the surgical site.  Reports that it is currently in a cast.  She reports that she is following up with Dr. Leanne Chang in 2 weeks.  Denies nausea, vomiting or fevers.  HPI     Home Medications Prior to Admission medications   Medication Sig Start Date End Date Taking? Authorizing Provider  acetaminophen (TYLENOL) 325 MG tablet Take 650 mg by mouth every 6 (six) hours as needed for mild pain (pain score 1-3).   Yes [provider]  diclofenac Sodium (VOLTAREN) 1 % GEL diclofenac 1 % topical gel  APPLY 2 GRAM TO THE AFFECTED AREA(S) BY TOPICAL ROUTE 4 TIMES PER DAY   Yes [provider]  gabapentin (NEURONTIN) 300 MG capsule TAKE 1 CAPSULE BY MOUTH 3 TIMES DAILY AS NEEDED. Patient taking differently: Take 300 mg by mouth at bedtime. 11/17/21  Yes Butch Penny, NP  Multiple Vitamin (MULTIVITAMIN WITH MINERALS) TABS tablet Take 1 tablet by mouth daily.   Yes [provider]  oxyCODONE (ROXICODONE) 5 MG immediate release tablet Take 1 tablet (5 mg total) by mouth every 6 (six) hours as needed for up to 7 days. 04/12/23 04/19/23 Yes Marlyne Beards, MD  ketorolac (TORADOL) 10 MG tablet Take 1 tablet (10 mg total)  by mouth every 6 (six) hours as needed. 04/13/23   Al Decant, PA-C      Allergies    Prednisone    Review of Systems   Review of Systems  Constitutional:  Negative for fever.  Gastrointestinal:  Negative for nausea and vomiting.  All other systems reviewed and are negative.   Physical Exam Updated Vital Signs BP (!) 144/85   Pulse 87   Temp 97.6 F (36.4 C)   Resp 18   LMP 01/12/2015   SpO2 100%  Physical Exam Vitals and nursing note reviewed.  Constitutional:      General: She is not in acute distress.    Appearance: She is well-developed.  HENT:     Head: Normocephalic and atraumatic.  Eyes:     Conjunctiva/sclera: Conjunctivae normal.  Cardiovascular:     Rate and Rhythm: Normal rate and regular rhythm.     Heart sounds: No murmur heard. Pulmonary:     Effort: Pulmonary effort is normal. No respiratory distress.     Breath sounds: Normal breath sounds.  Abdominal:     Palpations: Abdomen is soft.     Tenderness: There is no abdominal tenderness.  Musculoskeletal:        General: No swelling.     Cervical back: Neck supple.     Comments: Patient left hand splinted status post trapeziectomy.  Skin:    General: Skin is warm and dry.  Capillary Refill: Capillary refill takes less than 2 seconds.  Neurological:     Mental Status: She is alert.  Psychiatric:        Mood and Affect: Mood normal.     ED Results / Procedures / Treatments   Labs (all labs ordered are listed, but only abnormal results are displayed) Labs Reviewed - No data to display  EKG None  Radiology DG Hand 2 View Left  Result Date: 04/13/2023 CLINICAL DATA:  Severe postoperative pain following recent trapeziectomy. EXAM: LEFT HAND - 2 VIEW COMPARISON:  Preoperative views 02/26/2023 a, intraoperative C-arm views from yesterday. FINDINGS: There is overlying casting material. Edema and soft tissue gas are noted in the first and second webspace. There is postoperative change  with interval trapeziectomy. A short removable fixation pin traverses the first MCP joint obliquely. There is a threaded screw fragment in the ulnar aspect of the head of the first metacarpal as well. There is normal bone mineralization. No findings of acute osteomyelitis. No dislocation or fractures. IMPRESSION: 1. Postoperative changes with interval trapeziectomy, and removable fixation pin traversing the first MCP joint. 2. Edema and soft tissue gas in the first and second webspace. 3. No findings of acute osteomyelitis. Electronically Signed   By: Almira Bar M.D.   On: 04/13/2023 07:34   DG MINI C-ARM IMAGE ONLY  Result Date: 04/12/2023 There is no interpretation for this exam.  This order is for images obtained during a surgical procedure.  Please See "Surgeries" Tab for more information regarding the procedure.    Procedures Procedures   Medications Ordered in ED Medications  HYDROmorphone (DILAUDID) injection 1 mg (1 mg Intramuscular Given 04/13/23 0723)  ketorolac (TORADOL) 15 MG/ML injection 15 mg (15 mg Intramuscular Given 04/13/23 0855)  acetaminophen (TYLENOL) tablet 1,000 mg (1,000 mg Oral Given 04/13/23 4034)    ED Course/ Medical Decision Making/ A&P  Medical Decision Making Risk Prescription drug management.   47 year old female presents to ED after having procedure yesterday.  Please see HPI for further details.  On exam patient vital signs unremarkable.  Patient left hand is splinted status post trapeziectomy.  She denies any fevers, drainage from surgical site.  Denies any numbness of her left arm.  Collected x-ray imaging.  Does show soft tissue edema and gas.  Spoke with Dr. Frazier Butt who states that this is to be expected after recent operation.  Patient was given 1 mg IM Dilaudid as well as 15 mg IM Toradol and 1g Tylenol.  She reports that her pain is decreased at this time.  Will send her home with p.o. Toradol and have her follow-up with Dr. Frazier Butt.   Stable to discharge.   Final Clinical Impression(s) / ED Diagnoses Final diagnoses:  Post-operative pain    Rx / DC Orders ED Discharge Orders          Ordered    ketorolac (TORADOL) 10 MG tablet  Every 6 hours PRN,   Status:  Discontinued        04/13/23 0941    ketorolac (TORADOL) 10 MG tablet  Every 6 hours PRN        04/13/23 0941              Al Decant, PA-C 04/13/23 7425    Mesner, Barbara Cower, MD 04/14/23 [phone]

## 2023-04-13 NOTE — Op Note (Signed)
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  She presents today for left trapeziectomy with suspensionplasty and volar MCP capsulodesis.  Risks, benefits, and alternatives to surgery were again discussed with the patient in the preoperative area. The patient wishes to proceed with surgery.  Informed consent was signed after our discussion.   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  PROCEDURE:  Left trapeziectomy (16109) Left CMC arthroplasty with fiberlock suspension (60454) Left APL to FCR tendon transfer (09811) Left thumb MCP volar plate advancement with percutaneous pinning of MCPJ  (91478)   SURGEON: Waylan Rocher, M.D.  ASSIST:   ANESTHESIA:  Regional, MAC  IV FLUIDS AND URINE: See anesthesia.  ESTIMATED BLOOD LOSS: 10 mL.  IMPLANTS:  Implant Name Type Inv. Item Serial No. Manufacturer Lot No. LRB No. Used Action  [facility] SUSPENSION - [id number] Anchor ANCHOR [employer] [id number] Left 1 Implanted  ANCH SUT 2-0 3/8 CRC MIC FT 4 - [id number] Anchor ANCH SUT 2-0 3/8 CRC MIC FT 4  [employer] [id number] Left 1 Implanted     DRAINS: None  COMPLICATIONS: None  DESCRIPTION OF PROCEDURE: The patient was met in the preoperative holding area where the surgical site was marked and the consent form was signed.  The patient was then taken to the operating room and remained on the stretcher.  A hand table was placed adjacent to the operative extremity and locked into place.  All bony prominences were well padded.  A tourniquet was applied to the left upper arm.  Monitored sedation was induced.  The operative extremity was prepped and draped in the usual and  sterile fashion.  A formal time-out was performed to confirm that this was the correct patient, surgery, side, and site.   Following formal timeout, the limb was gently exsanguinated with an esmarch bandage and the tourniquet inflated to 250 mmHg.  A longitudinal incision was made over the dorsal aspect of the thumb from just distal to the [facility] joint to just distal to the radial styloid.  The skin was incised.  Blunt dissection was used with a tenotomy scissor to identify branches of the superficial radial nerve.  These were protected throughout the surgery.  Small superficial veins were coagulated with bipolar cautery as needed.  The APL and EPB tendons were identified.  The fascia overlying the tendons was divided using a tenotomy scissor.  Blunt dissection was used to identify the branch of the radial artery overlying the trapezium.  The artery was dissected free of adjacent soft tissue as was the accompanying vein.  Small branches were coagulated to allow for mobilization of the vessels.  A Weitlaner retractor was placed in the wound retracting the radial artery and accompanying vein away from the overlying joint capsule.  A 15 blade was used to perform a capsulotomy over the dorsal aspect of the metacarpal and along the dorsal aspect of the trapezium.  The capsuloligamentous structures were then circumferentially divided from their insertion on the trapezium.  A McGlamry elevator was then used to divide the remaining capsular ligamentous structures taking care to protect the underlying flexor carpi radialis tendon.  The trapezium was removed en bloc.  There was significant eburnation of the articular surface.  There was similar eburnation of the base of the metacarpal.  A Freer elevator was used to inspect the articulation between the trapezoid and the scaphoid.  There were minimal degenerative changes present.  The base of the index metacarpal was identified.  A guidewire was then placed obliquely across the  base of the index metacarpal exiting just distal to the interspace between the second and third metacarpals.  AP and lateral views demonstrated appropriate guidewire placement.  The fibertak drill guide was then screwed into the base of the index metacarpal.  The previously placed guidewire was removed and the path for the fiber lock anchor was drilled bicortically.  The anchor was then gently malleted into place.  Traction was pulled on the limbs of the suture which set the anchor.  I then turned my attention to the base of the thumb metacarpal.  A guidewire was then advanced at a 45 degree angle from the radial base of the thumb metacarpal midway between volar and dorsal.  This would ensure that the suture tape would sit nicely in the saddle of the metacarpal.  The previously placed guidewire was then overdrilled with the appropriate size drill.  The thumb was held in adducted position with slight traction.  The 2 limbs of the fiber tape were then inserted into the fork of the swivel lock anchor.  The anchor was then advanced into the base of the metacarpal with good bony purchase.  The anchor was buried below the subchondral bone.  With the suspension performed, the thumb had full passive abduction and abduction.  There was mild subsidence with axial load.  Given the mild subsidence and her activity level, I opted to include a tendon transfer procedure.  A small, distally based slip of the APL tendon was harvested proximally.  It was passed through the [facility] capsule and sutured to the FCR tendon.  The remaining APL tendon was then sutured in an accordion fashion and placed within the trapeziectomy space to serve as interposition material.  AP and lateral fluoroscopic views demonstrated anatomic suspension of the thumb metacarpal. The capsulotomy was repaired using a 4-0 Mersilene suture.  Following completion of the [facility] arthroplasty, I turned my attention to address the MCP hyperextension.  A chevron shaped  incision was made at the volar aspect of the thumb MCP joint directly over the A1 pulley.  Full-thickness skin flaps were elevated.  Longitudinal blunt dissection was used to identify the radial and ulnar digital nerves which were protected.  The A1 pulley was released.  The flexor pollicis longus tendon was retracted ulnarly, the volar plate was identified.  The volar plate was incised along its radial and ulnar margins and at its proximal insertion of the metacarpal.  The MCP joint was flexed to approximately 30 degrees and pinned in place using a 0.045 inch K wire.  A micro corkscrew anchor was then placed at the level of the metacarpal neck.  The volar plate was then sutured into place using the 2-0 sutures from the anchor.  AP and lateral view showed that the thumb was in approximately 30 degrees of flexion with appropriate K wire position.  The K wire was then cut and bent.  The flexor tendon was then placed back in its anatomic position.  The radial and ulnar digital nerves were found to be intact.  The skin was then closed using a 4-0 nylon suture in horizontal mattress fashion.    The tourniquet was deflated.  Hemostasis was achieved with direct pressure over the wound.  The dorsal wound was closed using  4-0 monocryl suture in buried fashion followed by 4-0 nylon suture in horizontal mattress fashion.  Both wounds were then dressed with Xeroform, folded Kerlix, cast padding, and a well-padded thumb spica splint was applied..   The patient was reversed from sedation.  All counts were correct x 2 at the end of the procedure.  The patient was then taken to the PACU in stable condition.   POSTOPERATIVE PLAN: She will be discharged home with appropriate pain medication and discharge instructions.  I will see her back in 10 to 14 days for first postop visit.  A referral to hand therapy has been placed.  Waylan Rocher, MD 10:13 AM

## 2023-04-13 NOTE — ED Triage Notes (Signed)
Patient arrived POV from home  with complaint of severe post Op pain after nerve block wore off.   Reports taking prescribed pain medication without relief.

## 2023-04-13 NOTE — Discharge Instructions (Addendum)
Please follow-up with Dr. Frazier Butt regarding your postoperative pain.  You may continue taking Tylenol every 6 hours 650 mg.  He may also take the Toradol that I have prescribed you every 6 hours as needed.  Please do not take ibuprofen with Toradol as they are both in the same drug class.  Please return with any new or worsening symptoms.

## 2024-01-12 ENCOUNTER — Other Ambulatory Visit: Payer: Self-pay | Admitting: Medical Genetics

## 2024-03-20 ENCOUNTER — Other Ambulatory Visit: Payer: Self-pay | Admitting: Medical Genetics

## 2024-03-20 DIAGNOSIS — Z006 Encounter for examination for normal comparison and control in clinical research program: Secondary | ICD-10-CM

## 2024-04-16 LAB — GENECONNECT MOLECULAR SCREEN: Genetic Analysis Overall Interpretation: NEGATIVE
